# Patient Record
Sex: Male | Born: 1960
Health system: Southern US, Community
[De-identification: ages and names within clinical notes are randomized; demographics above are authoritative.]

## PROBLEM LIST (undated history)

## (undated) DIAGNOSIS — I10 Essential (primary) hypertension: Secondary | ICD-10-CM

## (undated) DIAGNOSIS — E785 Hyperlipidemia, unspecified: Secondary | ICD-10-CM

## (undated) DIAGNOSIS — I2510 Atherosclerotic heart disease of native coronary artery without angina pectoris: Secondary | ICD-10-CM

## (undated) HISTORY — PX: CARDIAC CATHETERIZATION: SHX172

## (undated) HISTORY — PX: TONSILLECTOMY: SUR1361

---

## 1998-06-27 ENCOUNTER — Encounter: Admission: RE | Admit: 1998-06-27 | Discharge: 1998-06-27 | Payer: Self-pay | Admitting: *Deleted

## 2004-08-28 ENCOUNTER — Ambulatory Visit: Payer: Self-pay | Admitting: Family Medicine

## 2009-10-23 ENCOUNTER — Ambulatory Visit: Payer: Self-pay | Admitting: Otolaryngology

## 2009-12-16 ENCOUNTER — Ambulatory Visit: Payer: Self-pay | Admitting: Otolaryngology

## 2009-12-22 ENCOUNTER — Ambulatory Visit: Payer: Self-pay | Admitting: Otolaryngology

## 2016-02-28 ENCOUNTER — Emergency Department: Payer: 59

## 2016-02-28 ENCOUNTER — Encounter: Payer: Self-pay | Admitting: *Deleted

## 2016-02-28 ENCOUNTER — Emergency Department
Admission: EM | Admit: 2016-02-28 | Discharge: 2016-02-28 | Disposition: A | Discharge status: Home / Self Care | Payer: 59 | Attending: Emergency Medicine | Admitting: Emergency Medicine

## 2016-02-28 DIAGNOSIS — Y9389 Activity, other specified: Secondary | ICD-10-CM | POA: Insufficient documentation

## 2016-02-28 DIAGNOSIS — Y92410 Unspecified street and highway as the place of occurrence of the external cause: Secondary | ICD-10-CM | POA: Diagnosis not present

## 2016-02-28 DIAGNOSIS — F17210 Nicotine dependence, cigarettes, uncomplicated: Secondary | ICD-10-CM | POA: Diagnosis not present

## 2016-02-28 DIAGNOSIS — S80212A Abrasion, left knee, initial encounter: Secondary | ICD-10-CM | POA: Insufficient documentation

## 2016-02-28 DIAGNOSIS — S8992XA Unspecified injury of left lower leg, initial encounter: Secondary | ICD-10-CM | POA: Diagnosis present

## 2016-02-28 DIAGNOSIS — Y999 Unspecified external cause status: Secondary | ICD-10-CM | POA: Insufficient documentation

## 2016-02-28 DIAGNOSIS — M25511 Pain in right shoulder: Secondary | ICD-10-CM | POA: Insufficient documentation

## 2016-02-28 MED ORDER — TRAMADOL HCL 50 MG PO TABS: ORAL_TABLET | ORAL | Status: DC

## 2016-02-28 MED ORDER — TETANUS-DIPHTH-ACELL PERTUSSIS 5-2.5-18.5 LF-MCG/0.5 IM SUSP
0.5000 mL | Freq: Once | INTRAMUSCULAR | Status: AC
  Administered 2016-02-28: 0.5 mL via INTRAMUSCULAR
  Filled 2016-02-28: qty 0.5
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
## 2016-02-28 NOTE — ED Notes (Signed)
Discussed discharge instructions, prescriptions, and follow-up care with patient. No questions or concerns at this time. Pt stable at discharge.  
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
## 2016-02-28 NOTE — Discharge Instructions (Signed)
Follow-up with [facility] if any continued problems. Ice to shoulder as needed for comfort. Take naproxen as needed for inflammation. Tramadol as needed for severe pain.

## 2016-03-04 ENCOUNTER — Ambulatory Visit
Admission: EM | Admit: 2016-03-04 | Discharge: 2016-03-04 | Disposition: A | Discharge status: Home / Self Care | Payer: 59 | Attending: Family Medicine | Admitting: Family Medicine

## 2016-03-04 ENCOUNTER — Encounter: Payer: Self-pay | Admitting: *Deleted

## 2016-03-04 DIAGNOSIS — S40011A Contusion of right shoulder, initial encounter: Secondary | ICD-10-CM

## 2016-03-04 DIAGNOSIS — S161XXA Strain of muscle, fascia and tendon at neck level, initial encounter: Secondary | ICD-10-CM

## 2016-03-04 MED ORDER — NAPROXEN 500 MG PO TABS: 500.0000 mg | ORAL_TABLET | Freq: Two times a day (BID) | ORAL | Status: DC

## 2016-03-04 MED ORDER — TIZANIDINE HCL 4 MG PO TABS: 4.0000 mg | ORAL_TABLET | Freq: Four times a day (QID) | ORAL | Status: DC | PRN
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
## 2016-03-04 NOTE — Discharge Instructions (Signed)
Contusion °A contusion is a deep bruise. Contusions are the result of a blunt injury to tissues and muscle fibers under the skin. The injury causes bleeding under the skin. The skin overlying the contusion may turn blue, purple, or yellow. Minor injuries will give you a painless contusion, but more severe contusions may stay painful and swollen for a few weeks.  °CAUSES  °This condition is usually caused by a blow, trauma, or direct force to an area of the body. °SYMPTOMS  °Symptoms of this condition include: °· Swelling of the injured area. °· Pain and tenderness in the injured area. °· Discoloration. The area may have redness and then turn blue, purple, or yellow. °DIAGNOSIS  °This condition is diagnosed based on a physical exam and medical history. An X-ray, CT scan, or MRI may be needed to determine if there are any associated injuries, such as broken bones (fractures). °TREATMENT  °Specific treatment for this condition depends on what area of the body was injured. In general, the best treatment for a contusion is resting, icing, applying pressure to (compression), and elevating the injured area. This is often called the RICE strategy. Over-the-counter anti-inflammatory medicines may also be recommended for pain control.  °HOME CARE INSTRUCTIONS  °· Rest the injured area. °· If directed, apply ice to the injured area: °· Put ice in a plastic bag. °· Place a towel between your skin and the bag. °· Leave the ice on for 20 minutes, 2-3 times per day. °· If directed, apply light compression to the injured area using an elastic bandage. Make sure the bandage is not wrapped too tightly. Remove and reapply the bandage as directed by your health care provider. °· If possible, raise (elevate) the injured area above the level of your heart while you are sitting or lying down. °· Take over-the-counter and prescription medicines only as told by your health care provider. °SEEK MEDICAL CARE IF: °· Your symptoms do not  improve after several days of treatment. °· Your symptoms get worse. °· You have difficulty moving the injured area. °SEEK IMMEDIATE MEDICAL CARE IF:  °· You have severe pain. °· You have numbness in a hand or foot. °· Your hand or foot turns pale or cold. °  °This information is not intended to replace advice given to you by your health care provider. Make sure you discuss any questions you have with your health care provider. °  °Document Released: 06/09/2005 Document Revised: 05/21/2015 Document Reviewed: 01/15/2015 °Elsevier Interactive Patient Education ©2016 [employer] °Cervical Sprain °A cervical sprain is an injury in the neck in which the strong, fibrous tissues (ligaments) that connect your neck bones stretch or tear. Cervical sprains can range from mild to severe. Severe cervical sprains can cause the neck vertebrae to be unstable. This can lead to damage of the spinal cord and can result in serious nervous system problems. The amount of time it takes for a cervical sprain to get better depends on the cause and extent of the injury. Most cervical sprains heal in 1 to 3 weeks. °CAUSES  °Severe cervical sprains may be caused by:  °· Contact sport injuries (such as from football, rugby, wrestling, hockey, auto racing, gymnastics, diving, martial arts, or boxing).   °· Motor vehicle collisions.   °· Whiplash injuries. This is an injury from a sudden forward and backward whipping movement of the head and neck.  °· Falls.   °Mild cervical sprains may be caused by:  °· Being in an awkward position, such as while   cradling a telephone between your ear and shoulder.   °· Sitting in a chair that does not offer proper support.   °· Working at a poorly designed computer station.   °· Looking up or down for long periods of time.   °SYMPTOMS  °· Pain, soreness, stiffness, or a burning sensation in the front, back, or sides of the neck. This discomfort may develop immediately after the injury or slowly, 24 hours or  more after the injury.   °· Pain or tenderness directly in the middle of the back of the neck.   °· Shoulder or upper back pain.   °· Limited ability to move the neck.   °· Headache.   °· Dizziness.   °· Weakness, numbness, or tingling in the hands or arms.   °· Muscle spasms.   °· Difficulty swallowing or chewing.   °· Tenderness and swelling of the neck.   °DIAGNOSIS  °Most of the time your health care provider can diagnose a cervical sprain by taking your history and doing a physical exam. Your health care provider will ask about previous neck injuries and any known neck problems, such as arthritis in the neck. X-rays may be taken to find out if there are any other problems, such as with the bones of the neck. Other tests, such as a CT scan or MRI, may also be needed.  °TREATMENT  °Treatment depends on the severity of the cervical sprain. Mild sprains can be treated with rest, keeping the neck in place (immobilization), and pain medicines. Severe cervical sprains are immediately immobilized. Further treatment is done to help with pain, muscle spasms, and other symptoms and may include: °· Medicines, such as pain relievers, numbing medicines, or muscle relaxants.   °· Physical therapy. This may involve stretching exercises, strengthening exercises, and posture training. Exercises and improved posture can help stabilize the neck, strengthen muscles, and help stop symptoms from returning.   °HOME CARE INSTRUCTIONS  °· Put ice on the injured area.   °¨ Put ice in a plastic bag.   °¨ Place a towel between your skin and the bag.   °¨ Leave the ice on for 15-20 minutes, 3-4 times a day.   °· If your injury was severe, you may have been given a cervical collar to wear. A cervical collar is a two-piece collar designed to keep your neck from moving while it heals. °¨ Do not remove the collar unless instructed by your health care provider. °¨ If you have long hair, keep it outside of the collar. °¨ Ask your health care  provider before making any adjustments to your collar. Minor adjustments may be required over time to improve comfort and reduce pressure on your chin or on the back of your head. °¨ If you are allowed to remove the collar for cleaning or bathing, follow your health care provider's instructions on how to do so safely. °¨ Keep your collar clean by wiping it with mild soap and water and drying it completely. If the collar you have been given includes removable pads, remove them every 1-2 days and hand wash them with soap and water. Allow them to air dry. They should be completely dry before you wear them in the collar. °¨ If you are allowed to remove the collar for cleaning and bathing, wash and dry the skin of your neck. Check your skin for irritation or sores. If you see any, tell your health care provider. °¨ Do not drive while wearing the collar.   °· Only take over-the-counter or prescription medicines for pain, discomfort, or fever as directed by your health care   provider.   °· Keep all follow-up appointments as directed by your health care provider.   °· Keep all physical therapy appointments as directed by your health care provider.   °· Make any needed adjustments to your workstation to promote good posture.   °· Avoid positions and activities that make your symptoms worse.   °· Warm up and stretch before being active to help prevent problems.   °SEEK MEDICAL CARE IF:  °· Your pain is not controlled with medicine.   °· You are unable to decrease your pain medicine over time as planned.   °· Your activity level is not improving as expected.   °SEEK IMMEDIATE MEDICAL CARE IF:  °· You develop any bleeding. °· You develop stomach upset. °· You have signs of an allergic reaction to your medicine.   °· Your symptoms get worse.   °· You develop new, unexplained symptoms.   °· You have numbness, tingling, weakness, or paralysis in any part of your body.   °MAKE SURE YOU:  °· Understand these instructions. °· Will  watch your condition. °· Will get help right away if you are not doing well or get worse. °  °This information is not intended to replace advice given to you by your health care provider. Make sure you discuss any questions you have with your health care provider. °  °Document Released: 06/27/2007 Document Revised: 09/04/2013 Document Reviewed: 03/07/2013 °Elsevier Interactive Patient Education ©2016 [employer] Inc. ° °

## 2016-11-14 ENCOUNTER — Inpatient Hospital Stay
Admission: EM | Admit: 2016-11-14 | Discharge: 2016-11-16 | DRG: 247 | Disposition: A | Discharge status: Home / Self Care | Payer: 59 | Attending: Internal Medicine | Admitting: Internal Medicine

## 2016-11-14 ENCOUNTER — Emergency Department: Payer: 59

## 2016-11-14 ENCOUNTER — Encounter: Payer: Self-pay | Admitting: Emergency Medicine

## 2016-11-14 ENCOUNTER — Ambulatory Visit (INDEPENDENT_AMBULATORY_CARE_PROVIDER_SITE_OTHER)
Admission: EM | Admit: 2016-11-14 | Discharge: 2016-11-14 | Disposition: A | Discharge status: Other Acute Inpatient Hospital | Payer: 59 | Source: Home / Self Care | Attending: Family Medicine | Admitting: Family Medicine

## 2016-11-14 DIAGNOSIS — Z809 Family history of malignant neoplasm, unspecified: Secondary | ICD-10-CM | POA: Diagnosis not present

## 2016-11-14 DIAGNOSIS — R7989 Other specified abnormal findings of blood chemistry: Secondary | ICD-10-CM

## 2016-11-14 DIAGNOSIS — R072 Precordial pain: Secondary | ICD-10-CM

## 2016-11-14 DIAGNOSIS — I10 Essential (primary) hypertension: Secondary | ICD-10-CM | POA: Diagnosis present

## 2016-11-14 DIAGNOSIS — Z885 Allergy status to narcotic agent status: Secondary | ICD-10-CM

## 2016-11-14 DIAGNOSIS — R079 Chest pain, unspecified: Secondary | ICD-10-CM

## 2016-11-14 DIAGNOSIS — R778 Other specified abnormalities of plasma proteins: Secondary | ICD-10-CM

## 2016-11-14 DIAGNOSIS — E785 Hyperlipidemia, unspecified: Secondary | ICD-10-CM | POA: Diagnosis present

## 2016-11-14 DIAGNOSIS — I214 Non-ST elevation (NSTEMI) myocardial infarction: Secondary | ICD-10-CM

## 2016-11-14 DIAGNOSIS — I25110 Atherosclerotic heart disease of native coronary artery with unstable angina pectoris: Secondary | ICD-10-CM | POA: Diagnosis present

## 2016-11-14 DIAGNOSIS — Z8249 Family history of ischemic heart disease and other diseases of the circulatory system: Secondary | ICD-10-CM | POA: Diagnosis not present

## 2016-11-14 DIAGNOSIS — F17210 Nicotine dependence, cigarettes, uncomplicated: Secondary | ICD-10-CM | POA: Diagnosis present

## 2016-11-14 DIAGNOSIS — R0602 Shortness of breath: Secondary | ICD-10-CM | POA: Diagnosis present

## 2016-11-14 DIAGNOSIS — I219 Acute myocardial infarction, unspecified: Secondary | ICD-10-CM

## 2016-11-14 HISTORY — DX: Hyperlipidemia, unspecified: E78.5

## 2016-11-14 HISTORY — DX: Essential (primary) hypertension: I10

## 2016-11-14 HISTORY — DX: Acute myocardial infarction, unspecified: I21.9

## 2016-11-14 LAB — CBC WITH DIFFERENTIAL/PLATELET
Basophils Absolute: 0.1 10*3/uL (ref 0–0.1)
Basophils Relative: 1 %
EOS ABS: 0.1 10*3/uL (ref 0–0.7)
Eosinophils Relative: 1 %
HEMATOCRIT: 42.8 % (ref 40.0–52.0)
HEMOGLOBIN: 14.6 g/dL (ref 13.0–18.0)
LYMPHS ABS: 2.9 10*3/uL (ref 1.0–3.6)
Lymphocytes Relative: 30 %
MCH: 29.9 pg (ref 26.0–34.0)
MCHC: 34.1 g/dL (ref 32.0–36.0)
MCV: 87.7 fL (ref 80.0–100.0)
Monocytes Absolute: 0.6 10*3/uL (ref 0.2–1.0)
Monocytes Relative: 7 %
NEUTROS ABS: 5.8 10*3/uL (ref 1.4–6.5)
NEUTROS PCT: 61 %
Platelets: 229 10*3/uL (ref 150–440)
RBC: 4.88 MIL/uL (ref 4.40–5.90)
RDW: 13.5 % (ref 11.5–14.5)
WBC: 9.4 10*3/uL (ref 3.8–10.6)

## 2016-11-14 LAB — BASIC METABOLIC PANEL
ANION GAP: 5 (ref 5–15)
BUN: 16 mg/dL (ref 6–20)
CALCIUM: 9.6 mg/dL (ref 8.9–10.3)
CO2: 25 mmol/L (ref 22–32)
Chloride: 105 mmol/L (ref 101–111)
Creatinine, Ser: 1.15 mg/dL (ref 0.61–1.24)
GFR calc non Af Amer: >60 mL/min (ref >60)
Glucose, Bld: 99 mg/dL (ref 65–99)
Potassium: 4 mmol/L (ref 3.5–5.1)
SODIUM: 135 mmol/L (ref 135–145)

## 2016-11-14 LAB — LIPID PANEL
CHOL/HDL RATIO: 3.5 ratio
Cholesterol: 180 mg/dL (ref 0–200)
HDL: 52 mg/dL (ref >40)
LDL Cholesterol: 105 mg/dL — ABNORMAL HIGH (ref 0–99)
TRIGLYCERIDES: 117 mg/dL (ref <150)
VLDL: 23 mg/dL (ref 0–40)

## 2016-11-14 LAB — TROPONIN I
TROPONIN I: 5.14 ng/mL — AB (ref <0.03)
Troponin I: 5.79 ng/mL (ref <0.03)
Troponin I: 5.24 ng/mL (ref <0.03)

## 2016-11-14 LAB — CBC
HEMATOCRIT: 40.8 % (ref 40.0–52.0)
Hemoglobin: 14 g/dL (ref 13.0–18.0)
MCH: 30.4 pg (ref 26.0–34.0)
MCHC: 34.3 g/dL (ref 32.0–36.0)
MCV: 88.7 fL (ref 80.0–100.0)
PLATELETS: 220 10*3/uL (ref 150–440)
RBC: 4.6 MIL/uL (ref 4.40–5.90)
RDW: 13.4 % (ref 11.5–14.5)
WBC: 9.6 10*3/uL (ref 3.8–10.6)

## 2016-11-14 LAB — COMPREHENSIVE METABOLIC PANEL
ALBUMIN: 4.1 g/dL (ref 3.5–5.0)
ALK PHOS: 68 U/L (ref 38–126)
ALT: 15 U/L — AB (ref 17–63)
AST: 43 U/L — ABNORMAL HIGH (ref 15–41)
Anion gap: 5 (ref 5–15)
BILIRUBIN TOTAL: 0.6 mg/dL (ref 0.3–1.2)
BUN: 17 mg/dL (ref 6–20)
CALCIUM: 10.1 mg/dL (ref 8.9–10.3)
CO2: 28 mmol/L (ref 22–32)
CREATININE: 1.18 mg/dL (ref 0.61–1.24)
Chloride: 104 mmol/L (ref 101–111)
GFR calc Af Amer: >60 mL/min (ref >60)
GFR calc non Af Amer: >60 mL/min (ref >60)
GLUCOSE: 114 mg/dL — AB (ref 65–99)
Potassium: 4.9 mmol/L (ref 3.5–5.1)
Sodium: 137 mmol/L (ref 135–145)
Total Protein: 7.5 g/dL (ref 6.5–8.1)

## 2016-11-14 LAB — PROTIME-INR
INR: 0.89
INR: 0.9
PROTHROMBIN TIME: 12.1 s (ref 11.4–15.2)
Prothrombin Time: 12 seconds (ref 11.4–15.2)

## 2016-11-14 LAB — APTT: aPTT: 27 seconds (ref 24–36)

## 2016-11-14 MED ORDER — DIPHENHYDRAMINE HCL 25 MG PO CAPS
25.0000 mg | ORAL_CAPSULE | Freq: Every day | ORAL | Status: DC
  Administered 2016-11-14 – 2016-11-15 (×2): 25 mg via ORAL
  Filled 2016-11-14 (×2): qty 1

## 2016-11-14 MED ORDER — SODIUM CHLORIDE 0.9% FLUSH
3.0000 mL | Freq: Two times a day (BID) | INTRAVENOUS | Status: DC
  Administered 2016-11-15 – 2016-11-16 (×3): 3 mL via INTRAVENOUS

## 2016-11-14 MED ORDER — AMLODIPINE BESYLATE 5 MG PO TABS
5.0000 mg | ORAL_TABLET | Freq: Every day | ORAL | Status: DC
  Administered 2016-11-14 – 2016-11-15 (×2): 5 mg via ORAL
  Filled 2016-11-14 (×2): qty 1

## 2016-11-14 MED ORDER — DIPHENHYDRAMINE-APAP (SLEEP) 25-500 MG PO TABS: 1.0000 | ORAL_TABLET | Freq: Every day | ORAL | Status: DC

## 2016-11-14 MED ORDER — TIROFIBAN HCL IV 12.5 MG/250 ML
0.1500 ug/kg/min | INTRAVENOUS | Status: AC
  Administered 2016-11-14 (×2): 0.15 ug/kg/min via INTRAVENOUS
  Filled 2016-11-14 (×3): qty 100
  Filled 2016-11-14: qty 250

## 2016-11-14 MED ORDER — DOCUSATE SODIUM 100 MG PO CAPS: 100.0000 mg | ORAL_CAPSULE | Freq: Two times a day (BID) | ORAL | Status: DC | PRN

## 2016-11-14 MED ORDER — SODIUM CHLORIDE 0.9% FLUSH: 3.0000 mL | INTRAVENOUS | Status: DC | PRN

## 2016-11-14 MED ORDER — SODIUM CHLORIDE 0.9 % WEIGHT BASED INFUSION
1.0000 mL/kg/h | INTRAVENOUS | Status: DC
Start: 2016-11-15 — End: 2016-11-15
  Administered 2016-11-15: 1 mL/kg/h via INTRAVENOUS

## 2016-11-14 MED ORDER — SODIUM CHLORIDE 0.9 % IV SOLN: 250.0000 mL | INTRAVENOUS | Status: DC | PRN

## 2016-11-14 MED ORDER — LORATADINE 10 MG PO TABS
10.0000 mg | ORAL_TABLET | Freq: Every evening | ORAL | Status: DC
  Administered 2016-11-14 – 2016-11-15 (×2): 10 mg via ORAL
  Filled 2016-11-14 (×2): qty 1

## 2016-11-14 MED ORDER — NICOTINE 21 MG/24HR TD PT24
21.0000 mg | MEDICATED_PATCH | Freq: Every day | TRANSDERMAL | Status: DC
  Administered 2016-11-14 – 2016-11-15 (×2): 21 mg via TRANSDERMAL
  Filled 2016-11-14 (×3): qty 1

## 2016-11-14 MED ORDER — ASPIRIN 81 MG PO CHEW
324.0000 mg | CHEWABLE_TABLET | Freq: Once | ORAL | Status: AC
  Administered 2016-11-14: 324 mg via ORAL

## 2016-11-14 MED ORDER — TIROFIBAN (AGGRASTAT) BOLUS VIA INFUSION
25.0000 ug/kg | Freq: Once | INTRAVENOUS | Status: AC
  Administered 2016-11-14: 1927.5 ug via INTRAVENOUS
  Filled 2016-11-14 (×2): qty 39

## 2016-11-14 MED ORDER — NAPROXEN SODIUM 275 MG PO TABS
440.0000 mg | ORAL_TABLET | Freq: Every day | ORAL | Status: DC
  Filled 2016-11-14: qty 2

## 2016-11-14 MED ORDER — SODIUM CHLORIDE 0.9 % WEIGHT BASED INFUSION: 3.0000 mL/kg/h | INTRAVENOUS | Status: AC

## 2016-11-14 MED ORDER — HEPARIN (PORCINE) IN NACL 100-0.45 UNIT/ML-% IJ SOLN
12.0000 [IU]/kg/h | INTRAMUSCULAR | Status: DC
  Filled 2016-11-14: qty 250

## 2016-11-14 MED ORDER — FENTANYL CITRATE (PF) 100 MCG/2ML IJ SOLN: 100.0000 ug | INTRAMUSCULAR | Status: DC | PRN

## 2016-11-14 MED ORDER — NITROGLYCERIN 0.4 MG SL SUBL: 0.4000 mg | SUBLINGUAL_TABLET | SUBLINGUAL | Status: DC | PRN

## 2016-11-14 MED ORDER — HEPARIN BOLUS VIA INFUSION
4000.0000 [IU] | Freq: Once | INTRAVENOUS | Status: DC
Start: 2016-11-14 — End: 2016-11-14
  Filled 2016-11-14: qty 4000

## 2016-11-14 MED ORDER — NITROGLYCERIN 2 % TD OINT
0.5000 [in_us] | TOPICAL_OINTMENT | Freq: Once | TRANSDERMAL | Status: AC
  Administered 2016-11-14: 0.5 [in_us] via TOPICAL
  Filled 2016-11-14: qty 1

## 2016-11-14 MED ORDER — ASPIRIN 81 MG PO CHEW
81.0000 mg | CHEWABLE_TABLET | ORAL | Status: AC
  Administered 2016-11-15: 81 mg via ORAL
  Filled 2016-11-14: qty 1

## 2016-11-14 MED ORDER — ASPIRIN EC 325 MG PO TBEC: 325.0000 mg | DELAYED_RELEASE_TABLET | Freq: Every day | ORAL | Status: DC

## 2016-11-14 MED ORDER — NITROGLYCERIN 2 % TD OINT
0.5000 [in_us] | TOPICAL_OINTMENT | Freq: Four times a day (QID) | TRANSDERMAL | Status: DC
  Administered 2016-11-14 – 2016-11-15 (×3): 0.5 [in_us] via TOPICAL
  Filled 2016-11-14 (×3): qty 1

## 2016-11-14 MED ORDER — ACETAMINOPHEN 500 MG PO TABS
500.0000 mg | ORAL_TABLET | Freq: Every day | ORAL | Status: DC
  Administered 2016-11-14 – 2016-11-15 (×2): 500 mg via ORAL
  Filled 2016-11-14 (×2): qty 1

## 2016-11-14 MED ORDER — SODIUM CHLORIDE 0.9% FLUSH
3.0000 mL | Freq: Two times a day (BID) | INTRAVENOUS | Status: DC
  Administered 2016-11-14 – 2016-11-15 (×2): 3 mL via INTRAVENOUS

## 2016-11-14 NOTE — Discharge Instructions (Signed)
Going directly to ER.

## 2016-11-14 NOTE — ED Triage Notes (Signed)
Pt had episodes yesterday were he felt like someone was sitting on his chest, broke out in cold sweat and very nausea. He feels weak and when he walks he gets short of breath.

## 2016-11-14 NOTE — ED Provider Notes (Signed)
[facility] Emergency Department Provider Note    First MD Initiated Contact with Patient 11/14/16 [phone]     (approximate)  I have reviewed the triage vital signs and the nursing notes.   HISTORY  Chief Complaint Chest Pain    HPI Gabriel Grant is a 56 y.o. male with 2 days of progressively worsening exertional chest pain. Patient with a long history of tobacco abuse, hypertension as well as hyperlipidemia. States that yesterday he was working in the yard developed a mid sternal chest pain and pressure that he states felt like a "bruise "associated with shortness of breath and diaphoresis. This has been lasted roughly 30-45 minutes and improved with rest. Still had a dull aching sensation in his chest over the night. Woke up this morning and went to walk his dog and developed recurrence of the same chest pain. This time there is no diaphoresis but did have shortness of breath. Denies any cough. No orthopnea. No previous known history of CAD. Patient would urgent care where EKG was taken that showed evidence of nonspecific T-wave inversions in 3 and aVF. He was given nitroglycerin with improvement in his chest pain. He arrives to the ER with chest pain rated 2 out of 10 in severity. States he feels much better.   History reviewed. No pertinent past medical history. Family History  Problem Relation Age of Onset  . Cancer Mother   . Hypertension Father   . CAD Father    Past Surgical History:  Procedure Laterality Date  . TONSILLECTOMY     There are no active problems to display for this patient.     Prior to Admission medications   Medication Sig Start Date End Date Taking? Authorizing Provider  diphenhydramine-acetaminophen (TYLENOL PM) 25-500 MG TABS tablet Take 1 tablet by mouth at bedtime.   Yes Historical Provider, MD  loratadine (CLARITIN) 10 MG tablet Take 10 mg by mouth daily.   Yes Historical Provider, MD  naproxen sodium (ANAPROX) 220 MG  tablet Take 440 mg by mouth at bedtime.   Yes Historical Provider, MD    Allergies Codeine    Social History Social History  Substance Use Topics  . Smoking status: Current Every Day Smoker    Packs/day: 1.00    Types: Cigarettes  . Smokeless tobacco: Never Used  . Alcohol use No    Review of Systems Patient denies headaches, rhinorrhea, blurry vision, numbness, shortness of breath, chest pain, edema, cough, abdominal pain, nausea, vomiting, diarrhea, dysuria, fevers, rashes or hallucinations unless otherwise stated above in HPI. ____________________________________________   PHYSICAL EXAM:  VITAL SIGNS: Vitals:   11/14/16 1030 11/14/16 1100  BP: (!) 148/91 (!) 168/94  Pulse: (!) 55 (!) 57  Resp: 17 14  Temp:      Constitutional: Alert and oriented. Well appearing and in no acute distress. Eyes: Conjunctivae are normal. PERRL. EOMI. Head: Atraumatic. Nose: No congestion/rhinnorhea. Mouth/Throat: Mucous membranes are moist.  Oropharynx non-erythematous. Neck: No stridor. Painless ROM. No cervical spine tenderness to palpation Hematological/Lymphatic/Immunilogical: No cervical lymphadenopathy. Cardiovascular: Normal rate, regular rhythm. Grossly normal heart sounds.  Good peripheral circulation. Respiratory: Normal respiratory effort.  No retractions. Lungs CTAB. Gastrointestinal: Soft and nontender. No distention. No abdominal bruits. No CVA tenderness. Genitourinary:  Musculoskeletal: No lower extremity tenderness nor edema.  No joint effusions. Neurologic:  Normal speech and language. No gross focal neurologic deficits are appreciated. No gait instability. Skin:  Skin is warm, dry and intact. No rash noted. Psychiatric: Mood  and affect are normal. Speech and behavior are normal.  ____________________________________________   LABS (all labs ordered are listed, but only abnormal results are displayed)  Results for orders placed or performed during the hospital  encounter of 11/14/16 (from the past 24 hour(s))  CBC with Differential/Platelet     Status: None   Collection Time: 11/14/16  9:49 AM  Result Value Ref Range   WBC 9.4 3.8 - 10.6 K/uL   RBC 4.88 4.40 - 5.90 MIL/uL   Hemoglobin 14.6 13.0 - 18.0 g/dL   HCT 42.8 40.0 - 52.0 %   MCV 87.7 80.0 - 100.0 fL   MCH 29.9 26.0 - 34.0 pg   MCHC 34.1 32.0 - 36.0 g/dL   RDW 13.5 11.5 - 14.5 %   Platelets 229 150 - 440 K/uL   Neutrophils Relative % 61 %   Neutro Abs 5.8 1.4 - 6.5 K/uL   Lymphocytes Relative 30 %   Lymphs Abs 2.9 1.0 - 3.6 K/uL   Monocytes Relative 7 %   Monocytes Absolute 0.6 0.2 - 1.0 K/uL   Eosinophils Relative 1 %   Eosinophils Absolute 0.1 0 - 0.7 K/uL   Basophils Relative 1 %   Basophils Absolute 0.1 0 - 0.1 K/uL  Comprehensive metabolic panel     Status: Abnormal   Collection Time: 11/14/16  9:49 AM  Result Value Ref Range   Sodium 137 135 - 145 mmol/L   Potassium 4.9 3.5 - 5.1 mmol/L   Chloride 104 101 - 111 mmol/L   CO2 28 22 - 32 mmol/L   Glucose, Bld 114 (H) 65 - 99 mg/dL   BUN 17 6 - 20 mg/dL   Creatinine, Ser 1.18 0.61 - 1.24 mg/dL   Calcium 10.1 8.9 - 10.3 mg/dL   Total Protein 7.5 6.5 - 8.1 g/dL   Albumin 4.1 3.5 - 5.0 g/dL   AST 43 (H) 15 - 41 U/L   ALT 15 (L) 17 - 63 U/L   Alkaline Phosphatase 68 38 - 126 U/L   Total Bilirubin 0.6 0.3 - 1.2 mg/dL   GFR calc non Af Amer >60 >60 mL/min   GFR calc Af Amer >60 >60 mL/min   Anion gap 5 5 - 15  Troponin I     Status: Abnormal   Collection Time: 11/14/16  9:49 AM  Result Value Ref Range   Troponin I 5.79 (HH) <0.03 ng/mL  APTT     Status: None   Collection Time: 11/14/16 11:20 AM  Result Value Ref Range   aPTT 27 24 - 36 seconds  Protime-INR     Status: None   Collection Time: 11/14/16 11:20 AM  Result Value Ref Range   Prothrombin Time 12.0 11.4 - 15.2 seconds   INR 0.89    ____________________________________________  EKG My review and personal interpretation at Time: 9:34   Indication: chest  pain  Rate: 50  Rhythm: sinus Axis: normal Other: non specific st changes,no STEMI ____________________________________________  RADIOLOGY  I personally reviewed all radiographic images ordered to evaluate for the above acute complaints and reviewed radiology reports and findings.  These findings were personally discussed with the patient.  Please see medical record for radiology report. ____________________________________________   PROCEDURES  Procedure(s) performed:  Procedures    Critical Care performed: yes CRITICAL CARE Performed by: Merlyn Lot   Total critical care time: 30 minutes  Critical care time was exclusive of separately billable procedures and treating other patients.  Critical care was necessary  to treat or prevent imminent or life-threatening deterioration.  Critical care was time spent personally by me on the following activities: development of treatment plan with patient and/or surrogate as well as nursing, discussions with consultants, evaluation of patient's response to treatment, examination of patient, obtaining history from patient or surrogate, ordering and performing treatments and interventions, ordering and review of laboratory studies, ordering and review of radiographic studies, pulse oximetry and re-evaluation of patient's condition.  ____________________________________________   INITIAL IMPRESSION / ASSESSMENT AND PLAN / ED COURSE  Pertinent labs & imaging results that were available during my care of the patient were reviewed by me and considered in my medical decision making (see chart for details).  DDX: ACS, pericarditis, esophagitis, boerhaaves,  dissection, pna, bronchitis, costochondritis   TAHIR DIAMICO is a 56 y.o. who presents to the ED with chest pain as described above with EKG changes from urgent care to the ER here. No evidence of ST elevation MI. T-wave now upright in aVF and less pronounced in lead III.  chest x-ray  with a crisp aortic knob. Do not feel this is clinically consistent with dissection. Possible underlying component of bronchitis or COPD however given the EKG changes and his high risk to fill unstable angina is a concern.  The patient will be placed on continuous pulse oximetry and telemetry for monitoring.  Laboratory evaluation will be sent to evaluate for the above complaints.     Clinical Course as of Nov 15 1219  Nancy Fetter Nov 14, 2016  1030 Troponin elevated to 5.7. Patient currently chest pain-free. Based on his history and my concern for unstable angina will order heparin drip. Patient is artery received aspirin. We'll contact cardiology for further recommendations. Patient will require admission for further evaluation and management.  [PR]  1042 Spoke with Dr. Carren Rang who has recommended Aggrastat. He is currently at bedside evaluating patient. On reexamination the patient has no chest pain at this time. Do not feel his clinical consistent with a STEMI.  Have discussed with the patient and available family all diagnostics and treatments performed thus far and all questions were answered to the best of my ability. The patient demonstrates understanding and agreement with plan.   [PR]    Clinical Course User Index [PR] Merlyn Lot, MD     ____________________________________________   FINAL CLINICAL IMPRESSION(S) / ED DIAGNOSES  Final diagnoses:  Chest pain, unspecified type  Shortness of breath  Elevated troponin I level      NEW MEDICATIONS STARTED DURING THIS VISIT:  New Prescriptions   No medications on file     Note:  This document was prepared using Dragon voice recognition software and may include unintentional dictation errors.    Merlyn Lot, MD 11/14/16 [phone]

## 2016-11-14 NOTE — ED Triage Notes (Signed)
Pt ems from urgent care for chest pain that started yesterday. Pt describes pain as pressure, felt nauseated and sweaty. Pt given 324 aspirin and 1 nitro spray. Pt without pain now.

## 2016-11-14 NOTE — Progress Notes (Signed)
Gabriel Grant is a 56 y.o. male  [id number]  Primary Cardiologist: Neoma Laming Reason for Consultation: Chest pain with elevated troponin/non-STEMI  HPI: This is a 56 year old white male with a past medical history of hypertension hyperlipidemia and 1 pack per day smoking and strong family history of coronary artery disease presented with chest pains which started 4 PM yesterday. Patient states chest pain yesterday occurred like a dull to a pressure type lasting 15-20 minutes. Chest pain continued intermittently and this morning had recurrent 10-15 minute chest pain and presented to the emergency room where he was given nitroglycerin and currently is chest pain-free.   Review of Systems: No orthopnea PND or leg swelling   History reviewed. No pertinent past medical history.   (Not in a hospital admission)   . tirofiban  25 mcg/kg Intravenous Once    Infusions: . tirofiban      Allergies  Allergen Reactions  . Codeine Other (See Comments)    Severe headache.    Social History   Social History  . Marital status: Married    Spouse name: N/A  . Number of children: N/A  . Years of education: N/A   Occupational History  . Not on file.   Social History Main Topics  . Smoking status: Current Every Day Smoker    Packs/day: 1.00    Types: Cigarettes  . Smokeless tobacco: Never Used  . Alcohol use No  . Drug use: No  . Sexual activity: Not on file   Other Topics Concern  . Not on file   Social History Narrative  . No narrative on file    Family History  Problem Relation Age of Onset  . Cancer Mother   . Hypertension Father   . CAD Father     PHYSICAL EXAM: Vitals:   11/14/16 0939 11/14/16 1000  BP: (!) 153/95 (!) 164/98  Pulse: 63 (!) 53  Resp:  15  Temp: 97.7 F (36.5 C)     No intake or output data in the 24 hours ending 11/14/16 1114  General:  Well appearing. No respiratory difficulty HEENT: normal Neck: supple. no JVD. Carotids 2+ bilat;  no bruits. No lymphadenopathy or thryomegaly appreciated. Cor: PMI nondisplaced. Regular rate & rhythm. No rubs, gallops or murmurs. Lungs: clear Abdomen: soft, nontender, nondistended. No hepatosplenomegaly. No bruits or masses. Good bowel sounds. Extremities: no cyanosis, clubbing, rash, edema Neuro: alert & oriented x 3, cranial nerves grossly intact. moves all 4 extremities w/o difficulty. Affect pleasant.  [id number] sinus rhythm with LVH and nonspecific ST-T changes  Results for orders placed or performed during the hospital encounter of 11/14/16 (from the past 24 hour(s))  CBC with Differential/Platelet     Status: None   Collection Time: 11/14/16  9:49 AM  Result Value Ref Range   WBC 9.4 3.8 - 10.6 K/uL   RBC 4.88 4.40 - 5.90 MIL/uL   Hemoglobin 14.6 13.0 - 18.0 g/dL   HCT 42.8 40.0 - 52.0 %   MCV 87.7 80.0 - 100.0 fL   MCH 29.9 26.0 - 34.0 pg   MCHC 34.1 32.0 - 36.0 g/dL   RDW 13.5 11.5 - 14.5 %   Platelets 229 150 - 440 K/uL   Neutrophils Relative % 61 %   Neutro Abs 5.8 1.4 - 6.5 K/uL   Lymphocytes Relative 30 %   Lymphs Abs 2.9 1.0 - 3.6 K/uL   Monocytes Relative 7 %   Monocytes Absolute 0.6 0.2 - 1.0 K/uL  Eosinophils Relative 1 %   Eosinophils Absolute 0.1 0 - 0.7 K/uL   Basophils Relative 1 %   Basophils Absolute 0.1 0 - 0.1 K/uL  Comprehensive metabolic panel     Status: Abnormal   Collection Time: 11/14/16  9:49 AM  Result Value Ref Range   Sodium 137 135 - 145 mmol/L   Potassium 4.9 3.5 - 5.1 mmol/L   Chloride 104 101 - 111 mmol/L   CO2 28 22 - 32 mmol/L   Glucose, Bld 114 (H) 65 - 99 mg/dL   BUN 17 6 - 20 mg/dL   Creatinine, Ser 1.18 0.61 - 1.24 mg/dL   Calcium 10.1 8.9 - 10.3 mg/dL   Total Protein 7.5 6.5 - 8.1 g/dL   Albumin 4.1 3.5 - 5.0 g/dL   AST 43 (H) 15 - 41 U/L   ALT 15 (L) 17 - 63 U/L   Alkaline Phosphatase 68 38 - 126 U/L   Total Bilirubin 0.6 0.3 - 1.2 mg/dL   GFR calc non Af Amer >60 >60 mL/min   GFR calc Af Amer >60 >60 mL/min    Anion gap 5 5 - 15  Troponin I     Status: Abnormal   Collection Time: 11/14/16  9:49 AM  Result Value Ref Range   Troponin I 5.79 (HH) <0.03 ng/mL   Dg Chest Portable 1 View  Result Date: 11/14/2016 CLINICAL DATA:  Smoker with epigastric/anterior chest pain since yesterday afternoon. Denies any heart/lung disease EXAM: PORTABLE CHEST 1 VIEW COMPARISON:  None. FINDINGS: The heart size and mediastinal contours are within normal limits. Both lungs are clear. The visualized skeletal structures are unremarkable. IMPRESSION: No active disease. Electronically Signed   By: Nolon Nations M.D.   On: 11/14/2016 10:14     ASSESSMENT AND PLAN:Patient has ruled in for non-STEMI with elevated troponin of 5.79. Currently chest pain-free with no significant EKG changes to warrant taking the patient to the Cath Lab on Sunday. However due to non-STEMI will start the patient on Aggrastat. Also discussed risks and benefits with the patient in front of his mother and father who have agreed to undergo cardiac catheterization and possible PCI.  KHAN,SHAUKAT A

## 2016-11-14 NOTE — ED Provider Notes (Addendum)
[facility]    CSN: [id number] Arrival date & time: 11/14/16  0821     History   Chief Complaint Chief Complaint  Patient presents with  . Chest Pain   HPI  56 year old male with past history of tobacco abuse, hypertension, hyperlipidemia (currently untreated) presents with chest pain.  Patient reports that yesterday he was working on a gun and had sudden onset left-sided chest pain. He states that it felt like something was sitting on his chest. He states that it was 8-9 out of 10 in severity. Associated diaphoresis. His pain subsequently subsided after approximately 30 minutes. He elected not to go to the ER to be evaluated. This morning chest pain occurred again while he was walking his dog. He reports associated shortness of breath. He then decided to come in for evaluation. His current pain is 3-4 out of 10 in severity. Left-sided. No radiation. No current diaphoresis. He has a significant family history for coronary artery disease/heart disease. He has a past medical history of tobacco abuse and hypertension as well as hyperlipidemia.  Past Surgical History:  Procedure Laterality Date  . TONSILLECTOMY      Home Medications    Prior to Admission medications   Not on File    Family History Family History  Problem Relation Age of Onset  . Cancer Mother   . Hypertension Father   . CAD Father     Social History Social History  Substance Use Topics  . Smoking status: Current Every Day Smoker    Packs/day: 1.00    Types: Cigarettes  . Smokeless tobacco: Never Used  . Alcohol use No     Allergies   Codeine   Review of Systems Review of Systems  Constitutional: Positive for diaphoresis.  Respiratory: Positive for shortness of breath.   Cardiovascular: Positive for chest pain.  All other systems reviewed and are negative.  Physical Exam Triage Vital Signs ED Triage Vitals  Enc Vitals Group     BP 11/14/16 0828 (!) 194/87     Pulse Rate  11/14/16 0828 (!) 58     Resp 11/14/16 0828 18     Temp 11/14/16 0835 98 F (36.7 C)     Temp Source 11/14/16 0828 Oral     SpO2 11/14/16 0828 100 %     Weight 11/14/16 0831 170 lb (77.1 kg)     Height 11/14/16 0831 6' (1.829 m)     Head Circumference --      Peak Flow --      Pain Score 11/14/16 0832 3     Pain Loc --      Pain Edu? --      Excl. in [place]? --    Updated Vital Signs BP (!) 176/96 (BP Location: Right Arm)   Pulse (!) 59   Temp 98 F (36.7 C) (Oral)   Resp 18   Ht 6' (1.829 m)   Wt 170 lb (77.1 kg)   SpO2 100%   BMI 23.06 kg/m   Physical Exam  Constitutional: He is oriented to person, place, and time. He appears well-developed. No distress.  HENT:  Head: Normocephalic and atraumatic.  Eyes: Conjunctivae are normal.  Neck: Normal range of motion.  Cardiovascular: Normal rate and regular rhythm.   Pulmonary/Chest: Effort normal and breath sounds normal.  Abdominal: Soft. He exhibits no distension. There is no tenderness.  Musculoskeletal: Normal range of motion.  Neurological: He is alert and oriented to person, place, and time.  Skin: No rash noted.  Psychiatric: He has a normal mood and affect.  Vitals reviewed.  UC Treatments / Results  Labs (all labs ordered are listed, but only abnormal results are displayed) Labs Reviewed - No data to display  EKG   Date: 11/14/2016  EKG Time: 9:51 AM  Rate: 54  Rhythm: Sinus bradycardia  Axis:  Normal axis.  Intervals:Normal.  ST&T Change:  T-wave inversions in lead 2 and aVF.  Narrative Interpretation: sinus bradycardia at the rate of 54. T-wave inversions in lead 2 and aVF concerning for ischemia.  Radiology No results found.  Procedures Procedures (including critical care time)  Medications Ordered in UC Medications  aspirin chewable tablet 324 mg (324 mg Oral Given 11/14/16 0843)   Initial Impression / Assessment and Plan / UC Course  I have reviewed the triage vital signs and the nursing  notes.  Pertinent labs & imaging results that were available during my care of the patient were reviewed by me and considered in my medical decision making (see chart for details).    56 year old male presents with chest pain.  Appears to be cardiac in nature given history, and EKG findings concerning for ischemia with T-wave inversions in lead 2 and aVF. Aspirin given. Patient is being transported emergently to the ER for cardiology consultation/intervention.  Final Clinical Impressions(s) / UC Diagnoses   Final diagnoses:  Precordial pain    New Prescriptions There are no discharge medications for this patient.       Coral Spikes, DO 11/14/16 [phone]

## 2016-11-14 NOTE — Progress Notes (Addendum)
[facility] for Heparin/Tirofiban Indication: chest pain/ACS  Allergies  Allergen Reactions  . Codeine Other (See Comments)    Severe headache.    Patient Measurements: Height: 6' (182.9 cm) Weight: 170 lb (77.1 kg) IBW/kg (Calculated) : 77.6  Vital Signs: Temp: 97.7 F (36.5 C) (03/04 0939) Temp Source: Oral (03/04 0939) BP: 164/98 (03/04 1000) Pulse Rate: 53 (03/04 1000)  Labs:  Recent Labs  11/14/16 0949  HGB 14.6  HCT 42.8  PLT 229  CREATININE 1.18  TROPONINI 5.79*    Estimated Creatinine Clearance: 77.1 mL/min (by C-G formula based on SCr of 1.18 mg/dL).   Medical History: History reviewed. No pertinent past medical history.  Medications:   (Not in a hospital admission)  Assessment: 56 y/o M with a h/o tobacco abuse, HTN, and HLD admitted with CP. No anticoagulants PTA.   Goal of Therapy:  Heparin level 0.3-0.7 units/ml Monitor platelets by anticoagulation protocol: Yes   Plan:  Give 4000 units bolus x 1 Start heparin infusion at 950 units/hr Check anti-Xa level in 6 hours and daily while on heparin Continue to monitor H&H and platelets   Heparin drip discontinued and tirofiban ordered per pharmacy. Will order 25 mcg/kg bolus followed by infusion at 0.15 mcg/kg/min.   Ulice Dash D 11/14/2016,10:29 AM

## 2016-11-14 NOTE — Progress Notes (Signed)
Patient admitted to unit. Oriented to room, call bell, and staff. Bed in lowest position. Fall safety plan reviewed. Full assessment to Epic. Skin assessment verified with Particia Nearing RN. Telemetry box verification with tele clerk and Gerald Stabs NT- Box#: [id number]. Will continue to monitor.  Cardiac cath consent obtained and education handouts given. No questions or concerns at this time. Aggrastat gtt infusing upon admission to room. Family at bedside.

## 2016-11-14 NOTE — ED Notes (Signed)
This RN spoke with Dr. Humphrey Rolls over the phone regarding pt NPO status. Dr. Humphrey Rolls states pt is to be NPO starting at 0000 11/15/16 due to patient going to cath lab tomorrow at [place].

## 2016-11-14 NOTE — H&P (Signed)
[place] [facility] NAME: Gabriel Grant    MR#:  [id number]  DATE OF BIRTH:  1961/02/03  DATE OF ADMISSION:  11/14/2016  PRIMARY CARE PHYSICIAN: No primary care provider on file.   REQUESTING/REFERRING PHYSICIAN: robinson  CHIEF COMPLAINT:   Chief Complaint  Patient presents with  . Chest Pain    HISTORY OF PRESENT ILLNESS: Gabriel Grant  is a 56 y.o. male with a known history of Htn, Hyperlipidemia- was not taking meds for last 2-3 yrs as lost his insurance. Came with pressure like chest pain since last night, on-off. CAme to ER and found to have elevated troponin, Spoke to cardiologist and started on aggrastat drip and advised to have cath tomorrow.  PAST MEDICAL HISTORY:   Past Medical History:  Diagnosis Date  . Hyperlipidemia   . Hypertension     PAST SURGICAL HISTORY: Past Surgical History:  Procedure Laterality Date  . TONSILLECTOMY      SOCIAL HISTORY:  Social History  Substance Use Topics  . Smoking status: Current Every Day Smoker    Packs/day: 1.00    Types: Cigarettes  . Smokeless tobacco: Never Used  . Alcohol use No    FAMILY HISTORY:  Family History  Problem Relation Age of Onset  . Cancer Mother   . Hypertension Father   . CAD Father   . CAD Paternal Grandfather     DRUG ALLERGIES:  Allergies  Allergen Reactions  . Codeine Other (See Comments)    Severe headache.    REVIEW OF SYSTEMS:   CONSTITUTIONAL: No fever, fatigue or weakness.  EYES: No blurred or double vision.  EARS, NOSE, AND THROAT: No tinnitus or ear pain.  RESPIRATORY: No cough, shortness of breath, wheezing or hemoptysis.  CARDIOVASCULAR: positive for chest pain,no orthopnea, edema.  GASTROINTESTINAL: No nausea, vomiting, diarrhea or abdominal pain.  GENITOURINARY: No dysuria, hematuria.  ENDOCRINE: No polyuria, nocturia,  HEMATOLOGY: No anemia, easy bruising or bleeding SKIN: No rash or lesion. MUSCULOSKELETAL: No joint pain or  arthritis.   NEUROLOGIC: No tingling, numbness, weakness.  PSYCHIATRY: No anxiety or depression.   MEDICATIONS AT HOME:  Prior to Admission medications   Medication Sig Start Date End Date Taking? Authorizing Provider  diphenhydramine-acetaminophen (TYLENOL PM) 25-500 MG TABS tablet Take 1 tablet by mouth at bedtime.   Yes Historical Provider, MD  loratadine (CLARITIN) 10 MG tablet Take 10 mg by mouth daily.   Yes Historical Provider, MD  naproxen sodium (ANAPROX) 220 MG tablet Take 440 mg by mouth at bedtime.   Yes Historical Provider, MD      PHYSICAL EXAMINATION:   VITAL SIGNS: Blood pressure (!) 144/78, pulse (!) 54, temperature 97.8 F (36.6 C), temperature source Oral, resp. rate 18, height 6' (1.829 m), weight 77.1 kg (170 lb), SpO2 95 %.  GENERAL:  56 y.o.-year-old patient lying in the bed with no acute distress.  EYES: Pupils equal, round, reactive to light and accommodation. No scleral icterus. Extraocular muscles intact.  HEENT: Head atraumatic, normocephalic. Oropharynx and nasopharynx clear.  NECK:  Supple, no jugular venous distention. No thyroid enlargement, no tenderness.  LUNGS: Normal breath sounds bilaterally, no wheezing, rales,rhonchi or crepitation. No use of accessory muscles of respiration.  CARDIOVASCULAR: S1, S2 normal. No murmurs, rubs, or gallops.  ABDOMEN: Soft, nontender, nondistended. Bowel sounds present. No organomegaly or mass.  EXTREMITIES: No pedal edema, cyanosis, or clubbing.  NEUROLOGIC: Cranial nerves II through XII are intact. Muscle strength 5/5 in all  extremities. Sensation intact. Gait not checked.  PSYCHIATRIC: The patient is alert and oriented x 3.  SKIN: No obvious rash, lesion, or ulcer.   LABORATORY PANEL:   CBC  Recent Labs Lab 11/14/16 0949  WBC 9.4  HGB 14.6  HCT 42.8  PLT 229  MCV 87.7  MCH 29.9  MCHC 34.1  RDW 13.5  LYMPHSABS 2.9  MONOABS 0.6  EOSABS 0.1  BASOSABS 0.1    ------------------------------------------------------------------------------------------------------------------  Chemistries   Recent Labs Lab 11/14/16 0949  NA 137  K 4.9  CL 104  CO2 28  GLUCOSE 114*  BUN 17  CREATININE 1.18  CALCIUM 10.1  AST 43*  ALT 15*  ALKPHOS 68  BILITOT 0.6   ------------------------------------------------------------------------------------------------------------------ estimated creatinine clearance is 77.1 mL/min (by C-G formula based on SCr of 1.18 mg/dL). ------------------------------------------------------------------------------------------------------------------ No results for input(s): TSH, T4TOTAL, T3FREE, THYROIDAB in the last 72 hours.  Invalid input(s): FREET3   Coagulation profile  Recent Labs Lab 11/14/16 1120  INR 0.89   ------------------------------------------------------------------------------------------------------------------- No results for input(s): DDIMER in the last 72 hours. -------------------------------------------------------------------------------------------------------------------  Cardiac Enzymes  Recent Labs Lab 11/14/16 0949  TROPONINI 5.79*   ------------------------------------------------------------------------------------------------------------------ Invalid input(s): POCBNP  ---------------------------------------------------------------------------------------------------------------  Urinalysis No results found for: COLORURINE, APPEARANCEUR, LABSPEC, PHURINE, GLUCOSEU, HGBUR, BILIRUBINUR, KETONESUR, PROTEINUR, UROBILINOGEN, NITRITE, LEUKOCYTESUR   RADIOLOGY: Dg Chest Portable 1 View  Result Date: 11/14/2016 CLINICAL DATA:  Smoker with epigastric/anterior chest pain since yesterday afternoon. Denies any heart/lung disease EXAM: PORTABLE CHEST 1 VIEW COMPARISON:  None. FINDINGS: The heart size and mediastinal contours are within normal limits. Both lungs are clear. The  visualized skeletal structures are unremarkable. IMPRESSION: No active disease. Electronically Signed   By: Nolon Nations M.D.   On: 11/14/2016 10:14    EKG: Orders placed or performed during the hospital encounter of 11/14/16  . ED EKG  . ED EKG  . EKG 12-Lead  . EKG 12-Lead    IMPRESSION AND PLAN:  * NSTEMi   IV aggrastat drip, tele, troponin   Echo, cardio consult   Lipid panel, hemoglobin A1c.  * Hypertension   Start amlodipin   May need betablockers on d/c  * Hyperlipidemia   Statin.  * active smoking   Councelled to quit smoking for 4 min, offered nicotin patch.  All the records are reviewed and case discussed with ED provider. Management plans discussed with the patient, family and they are in agreement.  CODE STATUS: Full    Code Status Orders        Start     Ordered   11/14/16 1449  Full code  Continuous     11/14/16 1448    Code Status History    Date Active Date Inactive Code Status Order ID Comments User Context   This patient has a current code status but no historical code status.       TOTAL TIME TAKING CARE OF THIS PATIENT: 50 critical care minutes.    Vaughan Basta M.D on 11/14/2016   Between 7am to 6pm - Pager - [phone]  After 6pm go to [email or web address] - password [email or web address]  Office  [phone]  CC: Primary care physician; No primary care provider on file.   Note: This dictation was prepared with Dragon dictation along with smaller phrase technology. Any transcriptional errors that result from this process are unintentional.

## 2016-11-15 ENCOUNTER — Inpatient Hospital Stay
Admit: 2016-11-15 | Discharge: 2016-11-15 | Disposition: A | Discharge status: Home / Self Care | Payer: 59 | Attending: Internal Medicine | Admitting: Internal Medicine

## 2016-11-15 ENCOUNTER — Encounter
Admission: EM | Disposition: A | Discharge status: Home / Self Care | Payer: Self-pay | Source: Home / Self Care | Attending: Internal Medicine

## 2016-11-15 HISTORY — PX: LEFT HEART CATH AND CORONARY ANGIOGRAPHY: CATH118249

## 2016-11-15 HISTORY — PX: CORONARY STENT INTERVENTION: CATH118234

## 2016-11-15 LAB — TROPONIN I: Troponin I: 4.64 ng/mL (ref <0.03)

## 2016-11-15 LAB — HEMOGLOBIN A1C
Hgb A1c MFr Bld: 5.6 % (ref 4.8–5.6)
Mean Plasma Glucose: 114 mg/dL

## 2016-11-15 LAB — BASIC METABOLIC PANEL
Anion gap: 5 (ref 5–15)
BUN: 16 mg/dL (ref 6–20)
CHLORIDE: 105 mmol/L (ref 101–111)
CO2: 25 mmol/L (ref 22–32)
Calcium: 10.1 mg/dL (ref 8.9–10.3)
Creatinine, Ser: 1.19 mg/dL (ref 0.61–1.24)
GFR calc non Af Amer: >60 mL/min (ref >60)
Glucose, Bld: 102 mg/dL — ABNORMAL HIGH (ref 65–99)
POTASSIUM: 4.3 mmol/L (ref 3.5–5.1)
SODIUM: 135 mmol/L (ref 135–145)

## 2016-11-15 LAB — CBC
HCT: 42.4 % (ref 40.0–52.0)
Hemoglobin: 14.4 g/dL (ref 13.0–18.0)
MCH: 30.1 pg (ref 26.0–34.0)
MCHC: 34.1 g/dL (ref 32.0–36.0)
MCV: 88.5 fL (ref 80.0–100.0)
Platelets: 239 10*3/uL (ref 150–440)
RBC: 4.79 MIL/uL (ref 4.40–5.90)
RDW: 13.5 % (ref 11.5–14.5)
WBC: 10 10*3/uL (ref 3.8–10.6)

## 2016-11-15 LAB — ECHOCARDIOGRAM COMPLETE
HEIGHTINCHES: 72 in
WEIGHTICAEL: 2720 [oz_av]

## 2016-11-15 LAB — POCT ACTIVATED CLOTTING TIME: Activated Clotting Time: 334 seconds

## 2016-11-15 SURGERY — LEFT HEART CATH AND CORONARY ANGIOGRAPHY
Anesthesia: Moderate Sedation | Laterality: Right

## 2016-11-15 MED ORDER — ASPIRIN 81 MG PO CHEW
81.0000 mg | CHEWABLE_TABLET | Freq: Every day | ORAL | Status: DC
  Filled 2016-11-15: qty 1

## 2016-11-15 MED ORDER — ACETAMINOPHEN 325 MG PO TABS
650.0000 mg | ORAL_TABLET | ORAL | Status: DC | PRN
  Administered 2016-11-15: 650 mg via ORAL

## 2016-11-15 MED ORDER — HYDRALAZINE HCL 20 MG/ML IJ SOLN
INTRAMUSCULAR | Status: DC | PRN
  Administered 2016-11-15: 20 mg via INTRAVENOUS

## 2016-11-15 MED ORDER — BIVALIRUDIN 250 MG IV SOLR
INTRAVENOUS | Status: AC
  Filled 2016-11-15: qty 250

## 2016-11-15 MED ORDER — NITROGLYCERIN 1 MG/10 ML FOR IR/CATH LAB
INTRA_ARTERIAL | Status: DC | PRN
  Administered 2016-11-15: 200 ug

## 2016-11-15 MED ORDER — LABETALOL HCL 5 MG/ML IV SOLN: 10.0000 mg | INTRAVENOUS | Status: AC | PRN

## 2016-11-15 MED ORDER — HYDRALAZINE HCL 20 MG/ML IJ SOLN
INTRAMUSCULAR | Status: AC
  Filled 2016-11-15: qty 1

## 2016-11-15 MED ORDER — SODIUM CHLORIDE 0.9 % IV SOLN
0.2500 mg/kg/h | INTRAVENOUS | Status: AC
  Filled 2016-11-15: qty 250

## 2016-11-15 MED ORDER — HEPARIN (PORCINE) IN NACL 2-0.9 UNIT/ML-% IJ SOLN
INTRAMUSCULAR | Status: AC
  Filled 2016-11-15: qty 500

## 2016-11-15 MED ORDER — MIDAZOLAM HCL 2 MG/2ML IJ SOLN
INTRAMUSCULAR | Status: DC | PRN
  Administered 2016-11-15: 1 mg via INTRAVENOUS
  Administered 2016-11-15: 0.5 mg via INTRAVENOUS

## 2016-11-15 MED ORDER — BIVALIRUDIN BOLUS VIA INFUSION - CUPID
INTRAVENOUS | Status: DC | PRN
  Administered 2016-11-15: 57.825 mg via INTRAVENOUS

## 2016-11-15 MED ORDER — IOPAMIDOL (ISOVUE-300) INJECTION 61%
INTRAVENOUS | Status: DC | PRN
  Administered 2016-11-15: 165 mL via INTRA_ARTERIAL

## 2016-11-15 MED ORDER — MORPHINE SULFATE (PF) 2 MG/ML IV SOLN
2.0000 mg | Freq: Once | INTRAVENOUS | Status: AC
  Administered 2016-11-15: 2 mg via INTRAVENOUS

## 2016-11-15 MED ORDER — MORPHINE SULFATE (PF) 2 MG/ML IV SOLN
INTRAVENOUS | Status: AC
  Administered 2016-11-15: 2 mg via INTRAVENOUS
  Filled 2016-11-15: qty 1

## 2016-11-15 MED ORDER — SODIUM CHLORIDE 0.9% FLUSH
3.0000 mL | Freq: Two times a day (BID) | INTRAVENOUS | Status: DC
  Administered 2016-11-16: 3 mL via INTRAVENOUS

## 2016-11-15 MED ORDER — SODIUM CHLORIDE 0.9 % IV SOLN: 250.0000 mL | INTRAVENOUS | Status: DC | PRN

## 2016-11-15 MED ORDER — HYDRALAZINE HCL 20 MG/ML IJ SOLN: 5.0000 mg | INTRAMUSCULAR | Status: AC | PRN

## 2016-11-15 MED ORDER — TICAGRELOR 90 MG PO TABS
ORAL_TABLET | ORAL | Status: AC
  Filled 2016-11-15: qty 2

## 2016-11-15 MED ORDER — SODIUM CHLORIDE 0.9% FLUSH: 3.0000 mL | INTRAVENOUS | Status: DC | PRN

## 2016-11-15 MED ORDER — NICOTINE 21 MG/24HR TD PT24
21.0000 mg | MEDICATED_PATCH | Freq: Every day | TRANSDERMAL | Status: DC
  Administered 2016-11-16: 21 mg via TRANSDERMAL
  Filled 2016-11-15: qty 1

## 2016-11-15 MED ORDER — TICAGRELOR 90 MG PO TABS
ORAL_TABLET | ORAL | Status: DC | PRN
  Administered 2016-11-15: 180 mg via ORAL

## 2016-11-15 MED ORDER — FENTANYL CITRATE (PF) 100 MCG/2ML IJ SOLN
INTRAMUSCULAR | Status: AC
  Filled 2016-11-15: qty 2

## 2016-11-15 MED ORDER — ONDANSETRON HCL 4 MG/2ML IJ SOLN
INTRAMUSCULAR | Status: AC
  Administered 2016-11-15: 4 mg via INTRAVENOUS
  Filled 2016-11-15: qty 2

## 2016-11-15 MED ORDER — TICAGRELOR 90 MG PO TABS
90.0000 mg | ORAL_TABLET | Freq: Two times a day (BID) | ORAL | Status: DC
  Administered 2016-11-16: 90 mg via ORAL
  Filled 2016-11-15 (×2): qty 1

## 2016-11-15 MED ORDER — ONDANSETRON HCL 4 MG/2ML IJ SOLN
4.0000 mg | Freq: Four times a day (QID) | INTRAMUSCULAR | Status: DC | PRN
  Administered 2016-11-15: 4 mg via INTRAVENOUS

## 2016-11-15 MED ORDER — NITROGLYCERIN 5 MG/ML IV SOLN
INTRAVENOUS | Status: AC
Start: 2016-11-15 — End: 2016-11-15
  Filled 2016-11-15: qty 10

## 2016-11-15 MED ORDER — MIDAZOLAM HCL 2 MG/2ML IJ SOLN
INTRAMUSCULAR | Status: AC
  Filled 2016-11-15: qty 2

## 2016-11-15 MED ORDER — ATORVASTATIN CALCIUM 20 MG PO TABS
40.0000 mg | ORAL_TABLET | Freq: Every day | ORAL | Status: DC
  Administered 2016-11-15: 40 mg via ORAL
  Filled 2016-11-15: qty 2

## 2016-11-15 MED ORDER — FENTANYL CITRATE (PF) 100 MCG/2ML IJ SOLN
INTRAMUSCULAR | Status: DC | PRN
  Administered 2016-11-15: 50 ug via INTRAVENOUS
  Administered 2016-11-15: 25 ug via INTRAVENOUS

## 2016-11-15 MED ORDER — TIROFIBAN HCL IN NACL 5-0.9 MG/100ML-% IV SOLN
INTRAVENOUS | Status: AC
  Filled 2016-11-15: qty 100

## 2016-11-15 MED ORDER — IPRATROPIUM-ALBUTEROL 0.5-2.5 (3) MG/3ML IN SOLN
3.0000 mL | Freq: Four times a day (QID) | RESPIRATORY_TRACT | Status: DC
  Administered 2016-11-15 – 2016-11-16 (×3): 3 mL via RESPIRATORY_TRACT
  Filled 2016-11-15 (×4): qty 3

## 2016-11-15 MED ORDER — ACETAMINOPHEN 325 MG PO TABS
ORAL_TABLET | ORAL | Status: AC
  Filled 2016-11-15: qty 2

## 2016-11-15 MED ORDER — IOPAMIDOL (ISOVUE-300) INJECTION 61%
INTRAVENOUS | Status: DC | PRN
  Administered 2016-11-15: 110 mL via INTRA_ARTERIAL

## 2016-11-15 MED ORDER — BIVALIRUDIN 250 MG IV SOLR
INTRAVENOUS | Status: DC | PRN
  Administered 2016-11-15: 1.75 mg/kg/h via INTRAVENOUS

## 2016-11-15 MED ORDER — SODIUM CHLORIDE 0.9 % WEIGHT BASED INFUSION
1.0000 mL/kg/h | INTRAVENOUS | Status: AC
  Administered 2016-11-15: 1 mL/kg/h via INTRAVENOUS

## 2016-11-15 SURGICAL SUPPLY — 18 items
BALLN TREK RX 2.5X15 (BALLOONS) ×3
BALLOON TREK RX 2.5X15 (BALLOONS) IMPLANT
CATH INFINITI 5FR ANG PIGTAIL (CATHETERS) ×1 IMPLANT
CATH INFINITI 5FR JL4 (CATHETERS) ×1 IMPLANT
CATH INFINITI JR4 5F (CATHETERS) ×1 IMPLANT
CATH VISTA GUIDE 6FR JR4 (CATHETERS) ×1 IMPLANT
DEVICE CLOSURE MYNXGRIP 6/7F (Vascular Products) ×1 IMPLANT
DEVICE INFLAT 30 PLUS (MISCELLANEOUS) ×1 IMPLANT
KIT MANI 3VAL PERCEP (MISCELLANEOUS) ×3 IMPLANT
NDL PERC 18GX7CM (NEEDLE) IMPLANT
NEEDLE PERC 18GX7CM (NEEDLE) ×3 IMPLANT
PACK CARDIAC CATH (CUSTOM PROCEDURE TRAY) ×3 IMPLANT
SHEATH AVANTI 6FR X 11CM (SHEATH) ×1 IMPLANT
SHEATH PINNACLE 5F 10CM (SHEATH) ×1 IMPLANT
STENT XIENCE ALPINE RX 2.5X15 (Permanent Stent) ×1 IMPLANT
WIRE EMERALD 3MM-J .035X150CM (WIRE) ×1 IMPLANT
WIRE G HI TQ BMW 190 (WIRE) ×1 IMPLANT
WIRE HI TORQ TRAVERSE ST 190CM (WIRE) ×1 IMPLANT

## 2016-11-15 NOTE — Progress Notes (Signed)
Discontinue nitro paste per Dr. Humphrey Rolls.

## 2016-11-15 NOTE — Progress Notes (Signed)
SUBJECTIVE: Patient has no further chest pain   Vitals:   11/15/16 0414 11/15/16 0831 11/15/16 1002 11/15/16 1106  BP: (!) 146/79  (!) 154/93 (!) 146/108  Pulse: (!) 59  66 70  Resp: 18   18  Temp: 98.1 F (36.7 C)   98.3 F (36.8 C)  TempSrc: Oral   Oral  SpO2: 96% 96%  96%  Weight:      Height:        Intake/Output Summary (Last 24 hours) at 11/15/16 1131 Last data filed at 11/15/16 0416  Gross per 24 hour  Intake           339.94 ml  Output              875 ml  Net          -535.06 ml    LABS: Basic Metabolic Panel:  Recent Labs  11/14/16 2319 11/15/16 0424  NA 135 135  K 4.0 4.3  CL 105 105  CO2 25 25  GLUCOSE 99 102*  BUN 16 16  CREATININE 1.15 1.19  CALCIUM 9.6 10.1   Liver Function Tests:  Recent Labs  11/14/16 0949  AST 43*  ALT 15*  ALKPHOS 68  BILITOT 0.6  PROT 7.5  ALBUMIN 4.1   No results for input(s): LIPASE, AMYLASE in the last 72 hours. CBC:  Recent Labs  11/14/16 0949 11/14/16 2319 11/15/16 0424  WBC 9.4 9.6 10.0  NEUTROABS 5.8  --   --   HGB 14.6 14.0 14.4  HCT 42.8 40.8 42.4  MCV 87.7 88.7 88.5  PLT 229 220 239   Cardiac Enzymes:  Recent Labs  11/14/16 1513 11/14/16 1933 11/14/16 2319  TROPONINI 5.14* 5.24* 4.64*   BNP: Invalid input(s): POCBNP D-Dimer: No results for input(s): DDIMER in the last 72 hours. Hemoglobin A1C:  Recent Labs  11/14/16 1513  HGBA1C 5.6   Fasting Lipid Panel:  Recent Labs  11/14/16 1513  CHOL 180  HDL 52  LDLCALC 105*  TRIG 117  CHOLHDL 3.5   Thyroid Function Tests: No results for input(s): TSH, T4TOTAL, T3FREE, THYROIDAB in the last 72 hours.  Invalid input(s): FREET3 Anemia Panel: No results for input(s): VITAMINB12, FOLATE, FERRITIN, TIBC, IRON, RETICCTPCT in the last 72 hours.   PHYSICAL EXAM General: Well developed, well nourished, in no acute distress HEENT:  Normocephalic and atramatic Neck:  No JVD.  Lungs: Clear bilaterally to auscultation and  percussion. Heart: HRRR . Normal S1 and S2 without gallops or murmurs.  Abdomen: Bowel sounds are positive, abdomen soft and non-tender  Msk:  Back normal, normal gait. Normal strength and tone for age. Extremities: No clubbing, cyanosis or edema.   Neuro: Alert and oriented X 3. Psych:  Good affect, responds appropriately  TELEMETRY: NSR  ASSESSMENT AND PLAN:NSTEMI , patient has troponins ttrending and LVEF is still normal with WMA. Will be doing cath today.  Principal Problem:   Acute MI Active Problems:   NSTEMI (non-ST elevated myocardial infarction) ([place])    Neoma Laming A, MD, [facility] 11/15/2016 11:31 AM

## 2016-11-15 NOTE — Progress Notes (Signed)
Patient ID: Gabriel Grant, male   DOB: [birth year], 56 y.o.   MRN: [id number]  [employer] PROGRESS NOTE  ROCHELLE WAJDA [id number] DOB: 10/23/1960 DOA: 11/14/2016 PCP: No primary care provider on file.  HPI/Subjective: Patient thinks his heart attack started on Saturday afternoon where he felt bed with chest pain and shortness of breath which lasts about 15-20 minutes.he felt tired afterwards. On Sunday still not feeling that well and canceled his golf and decided to get checked out at the urgent care center sent him to the hospital. He was found with an elevated troponin.  Objective: Vitals:   11/14/16 1923 11/15/16 0414  BP: 117/71 (!) 146/79  Pulse: 68 (!) 59  Resp:  18  Temp: 98.4 F (36.9 C) 98.1 F (36.7 C)    Filed Weights   11/14/16 0943  Weight: 77.1 kg (170 lb)    ROS: Review of Systems  Constitutional: Negative for chills and fever.  Eyes: Negative for blurred vision.  Respiratory: Negative for cough and shortness of breath.   Cardiovascular: Negative for chest pain.  Gastrointestinal: Negative for abdominal pain, constipation, diarrhea, nausea and vomiting.  Genitourinary: Negative for dysuria.  Musculoskeletal: Negative for joint pain.  Neurological: Negative for dizziness and headaches.   Exam: Physical Exam  Constitutional: He is oriented to person, place, and time.  HENT:  Nose: No mucosal edema.  Mouth/Throat: No oropharyngeal exudate or posterior oropharyngeal edema.  Eyes: Conjunctivae, EOM and lids are normal. Pupils are equal, round, and reactive to light.  Neck: No JVD present. Carotid bruit is not present. No edema present. No thyroid mass and no thyromegaly present.  Cardiovascular: S1 normal and S2 normal.  Exam reveals no gallop.   No murmur heard. Pulses:      Dorsalis pedis pulses are 2+ on the right side, and 2+ on the left side.  Respiratory: No respiratory distress. He has decreased breath sounds in the right middle field, the right  lower field and the left lower field. He has wheezes in the right middle field, the right lower field, the left middle field and the left lower field. He has no rhonchi. He has no rales.  GI: Soft. Bowel sounds are normal. There is no tenderness.  Musculoskeletal:       Right ankle: He exhibits no swelling.       Left ankle: He exhibits no swelling.  Lymphadenopathy:    He has no cervical adenopathy.  Neurological: He is alert and oriented to person, place, and time. No cranial nerve deficit.  Skin: Skin is warm. No rash noted. Nails show no clubbing.  Psychiatric: He has a normal mood and affect.      Data Reviewed: Basic Metabolic Panel:  Recent Labs Lab 11/14/16 0949 11/14/16 2319 11/15/16 0424  NA 137 135 135  K 4.9 4.0 4.3  CL 104 105 105  CO2 28 25 25   GLUCOSE 114* 99 102*  BUN 17 16 16   CREATININE 1.18 1.15 1.19  CALCIUM 10.1 9.6 10.1   Liver Function Tests:  Recent Labs Lab 11/14/16 0949  AST 43*  ALT 15*  ALKPHOS 68  BILITOT 0.6  PROT 7.5  ALBUMIN 4.1   CBC:  Recent Labs Lab 11/14/16 0949 11/14/16 2319 11/15/16 0424  WBC 9.4 9.6 10.0  NEUTROABS 5.8  --   --   HGB 14.6 14.0 14.4  HCT 42.8 40.8 42.4  MCV 87.7 88.7 88.5  PLT 229 220 239   Cardiac Enzymes:  Recent Labs  Lab 11/14/16 0949 11/14/16 1513 11/14/16 1933 11/14/16 2319  TROPONINI 5.79* 5.14* 5.24* 4.64*     Studies: Dg Chest Portable 1 View  Result Date: 11/14/2016 CLINICAL DATA:  Smoker with epigastric/anterior chest pain since yesterday afternoon. Denies any heart/lung disease EXAM: PORTABLE CHEST 1 VIEW COMPARISON:  None. FINDINGS: The heart size and mediastinal contours are within normal limits. Both lungs are clear. The visualized skeletal structures are unremarkable. IMPRESSION: No active disease. Electronically Signed   By: Nolon Nations M.D.   On: 11/14/2016 10:14    Scheduled Meds: . acetaminophen  500 mg Oral QHS   And  . diphenhydrAMINE  25 mg Oral QHS  .  amLODipine  5 mg Oral Daily  . aspirin EC  325 mg Oral Daily  . atorvastatin  40 mg Oral q1800  . ipratropium-albuterol  3 mL Nebulization Q6H  . loratadine  10 mg Oral QPM  . nicotine  21 mg Transdermal Daily  . nitroGLYCERIN  0.5 inch Topical Q6H  . sodium chloride flush  3 mL Intravenous Q12H  . sodium chloride flush  3 mL Intravenous Q12H   Continuous Infusions: . sodium chloride      Assessment/Plan:  1. NSTEMI. Patient on aspirin. Heart rate borderline at this time and unable to give beta blocker at this point. Patient also on nitroglycerin patch. Patient to have a cardiac catheterization today. And atorvastatin 40 mg daily 2. Essential hypertension on Norvasc 3. Hyperlipidemia unspecified LDL 105: Less than 70. Start Lipitor 40 mg daily 4. Tobacco abuse. Start DuoNeb nebulizer solution. Patient already on nicotine patch. Tobacco abuse counseling done 4 minutes by me.  Code Status:     Code Status Orders        Start     Ordered   11/14/16 1449  Full code  Continuous     11/14/16 1448    Code Status History    Date Active Date Inactive Code Status Order ID Comments User Context   This patient has a current code status but no historical code status.     Family Communication: wife at the bedside Disposition Plan: depending on results of cardiac catheterization  Consultants:  Dr. Humphrey Rolls  Time spent: 25 minutes now  [place], [place]

## 2016-11-15 NOTE — Progress Notes (Signed)
Patient back from PCI. Resting quietly, no complaints. Family at bedside. PAD to right groin remains inflated at 40cc. Per [facility] RN, Coralyn Mark, can attempt to deflate halfway at bedtime and remaining amount in the morning. Will monitor closely as patient was on angiomax drip while in [facility]. Spoke with pharmacist - patient is still ok to receive first dose of brilinta tonight as scheduled. Patient was able to sit up at 1645, but reminded to stay in bed and limit mobility while PAD is in place.

## 2016-11-15 NOTE — Progress Notes (Signed)
*  PRELIMINARY RESULTS* Echocardiogram 2D Echocardiogram has been performed.  Sherrie Sport 11/15/2016, 9:47 AM

## 2016-11-15 NOTE — Care Management (Signed)
Admitted to [facility] with the diagnosis of acute MI. Lives with wife, Darnelle Bos [phone]). Seen Dr. Ellison Hughs in the past as primary care physician. Seen at [facility] prior to presenting to the emergency room. Scheduled for Cardiac Catheterization today. No needs identified at present [place] Management

## 2016-11-16 ENCOUNTER — Encounter: Payer: Self-pay | Admitting: Cardiovascular Disease

## 2016-11-16 MED ORDER — METOPROLOL SUCCINATE ER 25 MG PO TB24: 25.0000 mg | ORAL_TABLET | Freq: Every day | ORAL | 0 refills | Status: DC

## 2016-11-16 MED ORDER — NITROGLYCERIN 0.4 MG SL SUBL: 0.4000 mg | SUBLINGUAL_TABLET | SUBLINGUAL | 0 refills | Status: AC | PRN

## 2016-11-16 MED ORDER — ASPIRIN 81 MG PO TBEC: 81.0000 mg | DELAYED_RELEASE_TABLET | Freq: Every day | ORAL | 0 refills | Status: AC

## 2016-11-16 MED ORDER — ASPIRIN EC 81 MG PO TBEC
81.0000 mg | DELAYED_RELEASE_TABLET | Freq: Every day | ORAL | Status: DC
  Administered 2016-11-16: 81 mg via ORAL

## 2016-11-16 MED ORDER — TICAGRELOR 90 MG PO TABS: 90.0000 mg | ORAL_TABLET | Freq: Two times a day (BID) | ORAL | 0 refills | Status: DC

## 2016-11-16 MED ORDER — METOPROLOL SUCCINATE ER 25 MG PO TB24
25.0000 mg | ORAL_TABLET | Freq: Every day | ORAL | Status: DC
  Administered 2016-11-16: 25 mg via ORAL
  Filled 2016-11-16: qty 1

## 2016-11-16 MED ORDER — NICOTINE 21 MG/24HR TD PT24: 21.0000 mg | MEDICATED_PATCH | Freq: Every day | TRANSDERMAL | 0 refills | Status: DC

## 2016-11-16 MED ORDER — ATORVASTATIN CALCIUM 40 MG PO TABS: 40.0000 mg | ORAL_TABLET | Freq: Every day | ORAL | 0 refills | Status: DC

## 2016-11-16 NOTE — Discharge Summary (Signed)
[place] [facility] [place] NAME: Gabriel Grant    MR#:  [id number]  DATE OF BIRTH:  1961/06/21  DATE OF ADMISSION:  11/14/2016 ADMITTING PHYSICIAN: Vaughan Basta, MD  DATE OF DISCHARGE: 11/16/2016 11:45 AM  PRIMARY CARE PHYSICIAN: Dr Thereasa Distance   ADMISSION DIAGNOSIS:  Shortness of breath [R06.02] Elevated troponin I level [R74.8] Chest pain, unspecified type [R07.9]  DISCHARGE DIAGNOSIS:  Principal Problem:   Acute MI Active Problems:   NSTEMI (non-ST elevated myocardial infarction) ([place])   SECONDARY DIAGNOSIS:   Past Medical History:  Diagnosis Date  . Hyperlipidemia   . Hypertension     HOSPITAL COURSE:   1. NSTEMI. The patient was taken to the cardiac catheter lab and had a distal RCA lesion 100% stenosis which was successful PCI and stent. The patient felt well post procedure. Patient prescribed aspirin, Brilinta, Lipitor and low-dose Toprol-XL. Initially we were unable to do beta blocker secondary to low heart rate. But heart rate improved after the procedure and able to do a low dose Toprol. Note for out of work for 1 week. 2. Essential hypertension. Switched Norvasc over to Toprol 3. Hyperlipidemia unspecified with an LDL of 105. Goal less than 70. Continue Lipitor that was started here in the hospital. 4. Tobacco abuse urine nicotine patch prescribed upon discharge. Patient's lungs were clear upon discharge home. Prior to the cardiac catheter he did have a slight wheeze. Patient was given nebulizer treatments here.  DISCHARGE CONDITIONS:   Satisfactory  CONSULTS OBTAINED:  Treatment Team:  Dionisio David, MD  DRUG ALLERGIES:   Allergies  Allergen Reactions  . Codeine Other (See Comments)    Severe headache.    DISCHARGE MEDICATIONS:   Discharge Medication List as of 11/16/2016 11:04 AM    START taking these medications   Details  aspirin EC 81 MG EC tablet Take 1 tablet (81 mg total) by mouth daily.,  Starting Tue 11/16/2016, Print    atorvastatin (LIPITOR) 40 MG tablet Take 1 tablet (40 mg total) by mouth daily at 6 PM., Starting Tue 11/16/2016, Print    metoprolol succinate (TOPROL-XL) 25 MG 24 hr tablet Take 1 tablet (25 mg total) by mouth daily., Starting Tue 11/16/2016, Print    nicotine (NICODERM CQ - DOSED IN MG/24 HOURS) 21 mg/24hr patch Place 1 patch (21 mg total) onto the skin daily., Starting Tue 11/16/2016, Print    nitroGLYCERIN (NITROSTAT) 0.4 MG SL tablet Place 1 tablet (0.4 mg total) under the tongue every 5 (five) minutes as needed for chest pain., Starting Tue 11/16/2016, Print    ticagrelor (BRILINTA) 90 MG TABS tablet Take 1 tablet (90 mg total) by mouth 2 (two) times daily., Starting Tue 11/16/2016, Print      CONTINUE these medications which have NOT CHANGED   Details  diphenhydramine-acetaminophen (TYLENOL PM) 25-500 MG TABS tablet Take 1 tablet by mouth at bedtime., Historical Med    loratadine (CLARITIN) 10 MG tablet Take 10 mg by mouth daily., Historical Med      STOP taking these medications     naproxen sodium (ANAPROX) 220 MG tablet          DISCHARGE INSTRUCTIONS:   Follow-up with cardiology on Friday Follow-up with PMD in 2 weeks  If you experience worsening of your admission symptoms, develop shortness of breath, life threatening emergency, suicidal or homicidal thoughts you must seek medical attention immediately by calling 911 or calling your MD immediately  if symptoms less severe.  You Must read complete instructions/literature along with all the possible adverse reactions/side effects for all the Medicines you take and that have been prescribed to you. Take any new Medicines after you have completely understood and accept all the possible adverse reactions/side effects.   Please note  You were cared for by a hospitalist during your hospital stay. If you have any questions about your discharge medications or the care you received while you were in the  hospital after you are discharged, you can call the unit and asked to speak with the hospitalist on call if the hospitalist that took care of you is not available. Once you are discharged, your primary care physician will handle any further medical issues. Please note that NO REFILLS for any discharge medications will be authorized once you are discharged, as it is imperative that you return to your primary care physician (or establish a relationship with a primary care physician if you do not have one) for your aftercare needs so that they can reassess your need for medications and monitor your lab values.    Today   CHIEF COMPLAINT:   Chief Complaint  Patient presents with  . Chest Pain    HISTORY OF PRESENT ILLNESS:  Gabriel Grant  is a 56 y.o. male presented with chest pain and found to have an NSTEMI   VITAL SIGNS:  Blood pressure 139/84, pulse 72, temperature 98.1 F (36.7 C), temperature source Oral, resp. rate 18, height 6' (1.829 m), weight 77.1 kg (170 lb), SpO2 97 %.    PHYSICAL EXAMINATION:  GENERAL:  56 y.o.-year-old patient lying in the bed with no acute distress.  EYES: Pupils equal, round, reactive to light and accommodation. No scleral icterus. Extraocular muscles intact.  HEENT: Head atraumatic, normocephalic. Oropharynx and nasopharynx clear.  NECK:  Supple, no jugular venous distention. No thyroid enlargement, no tenderness.  LUNGS: Normal breath sounds bilaterally, no wheezing, rales,rhonchi or crepitation. No use of accessory muscles of respiration.  CARDIOVASCULAR: S1, S2 normal. No murmurs, rubs, or gallops.  ABDOMEN: Soft, non-tender, non-distended. Bowel sounds present. No organomegaly or mass.  EXTREMITIES: No pedal edema, cyanosis, or clubbing.  NEUROLOGIC: Cranial nerves II through XII are intact. Muscle strength 5/5 in all extremities. Sensation intact. Gait not checked.  PSYCHIATRIC: The patient is alert and oriented x 3.  SKIN: No obvious rash, lesion,  or ulcer.   DATA REVIEW:   CBC  Recent Labs Lab 11/15/16 0424  WBC 10.0  HGB 14.4  HCT 42.4  PLT 239    Chemistries   Recent Labs Lab 11/14/16 0949  11/15/16 0424  NA 137  < > 135  K 4.9  < > 4.3  CL 104  < > 105  CO2 28  < > 25  GLUCOSE 114*  < > 102*  BUN 17  < > 16  CREATININE 1.18  < > 1.19  CALCIUM 10.1  < > 10.1  AST 43*  --   --   ALT 15*  --   --   ALKPHOS 68  --   --   BILITOT 0.6  --   --   < > = values in this interval not displayed.  Cardiac Enzymes  Recent Labs Lab 11/14/16 2319  TROPONINI 4.64*    Management plans discussed with the patient, family and they are in agreement.  CODE STATUS:  Code Status History    Date Active Date Inactive Code Status Order ID Comments User Context   11/14/2016  2:48 PM 11/16/2016  2:50 PM Full Code [id number]  Vaughan Basta, MD Inpatient      TOTAL TIME TAKING CARE OF THIS PATIENT: 35 minutes.    [name] M.D on 11/16/2016 at 5:14 PM  Between 7am to 6pm - Pager - [phone]  After 6pm go to [email or web address] - password [email or web address]  [employer] Office  [phone]  CC: Primary care physician; Dr. Thereasa Distance

## 2016-11-16 NOTE — Progress Notes (Signed)
SUBJECTIVE:Doing well   Vitals:   11/15/16 1937 11/15/16 2015 11/16/16 0628 11/16/16 0730  BP: (!) 168/82  139/84   Pulse: 85  72   Resp:   18   Temp:   98.1 F (36.7 C)   TempSrc:   Oral   SpO2: 98% 98% 97% 97%  Weight:      Height:        Intake/Output Summary (Last 24 hours) at 11/16/16 1000 Last data filed at 11/16/16 [id number]  Gross per 24 hour  Intake           772.29 ml  Output             2300 ml  Net         -1527.71 ml    LABS: Basic Metabolic Panel:  Recent Labs  11/14/16 2319 11/15/16 0424  NA 135 135  K 4.0 4.3  CL 105 105  CO2 25 25  GLUCOSE 99 102*  BUN 16 16  CREATININE 1.15 1.19  CALCIUM 9.6 10.1   Liver Function Tests:  Recent Labs  11/14/16 0949  AST 43*  ALT 15*  ALKPHOS 68  BILITOT 0.6  PROT 7.5  ALBUMIN 4.1   No results for input(s): LIPASE, AMYLASE in the last 72 hours. CBC:  Recent Labs  11/14/16 0949 11/14/16 2319 11/15/16 0424  WBC 9.4 9.6 10.0  NEUTROABS 5.8  --   --   HGB 14.6 14.0 14.4  HCT 42.8 40.8 42.4  MCV 87.7 88.7 88.5  PLT 229 220 239   Cardiac Enzymes:  Recent Labs  11/14/16 1513 11/14/16 1933 11/14/16 2319  TROPONINI 5.14* 5.24* 4.64*   BNP: Invalid input(s): POCBNP D-Dimer: No results for input(s): DDIMER in the last 72 hours. Hemoglobin A1C:  Recent Labs  11/14/16 1513  HGBA1C 5.6   Fasting Lipid Panel:  Recent Labs  11/14/16 1513  CHOL 180  HDL 52  LDLCALC 105*  TRIG 117  CHOLHDL 3.5   Thyroid Function Tests: No results for input(s): TSH, T4TOTAL, T3FREE, THYROIDAB in the last 72 hours.  Invalid input(s): FREET3 Anemia Panel: No results for input(s): VITAMINB12, FOLATE, FERRITIN, TIBC, IRON, RETICCTPCT in the last 72 hours.   PHYSICAL EXAM General: Well developed, well nourished, in no acute distress HEENT:  Normocephalic and atramatic Neck:  No JVD.  Lungs: Clear bilaterally to auscultation and percussion. Heart: HRRR . Normal S1 and S2 without gallops or murmurs.   Abdomen: Bowel sounds are positive, abdomen soft and non-tender  Msk:  Back normal, normal gait. Normal strength and tone for age. Extremities: No clubbing, cyanosis or edema.   Neuro: Alert and oriented X 3. Psych:  Good affect, responds appropriately  TELEMETRY: NSR  ASSESSMENT AND PLAN: s/p PCI Distal RCA DES, may go home with f/u Friday at 10 am.  Principal Problem:   Acute MI Active Problems:   NSTEMI (non-ST elevated myocardial infarction) ([place])    Arcenio Mullaly A, MD, [facility] 11/16/2016 10:00 AM

## 2016-11-16 NOTE — Care Management (Signed)
Discharge to home today per Dr. Leslye Peer. [facility] 30 day card given as requested. Wife will transport.  Shelbie Ammons RN MSN CCM Care Management

## 2016-11-16 NOTE — Progress Notes (Signed)
Patient ID: Gabriel Grant, male   DOB: 05-02-1961, 56 y.o.   MRN: [id number] [place] [facility] [place] was admitted to the Hospital on 11/14/2016 and Discharged  11/16/2016 and should be excused from work/school   for 9 days starting 11/14/2016 , Please see Dr Humphrey Rolls prior to clearance to go back to work.  [name] M.D on 11/16/2016,at 8:16 AM  [place] [facility] [place]

## 2016-11-16 NOTE — Progress Notes (Signed)
Pt has orders to be discharged. Discharge instructions given and pt has no additional questions at this time. Medication regimen reviewed and pt educated. Pt verbalized understanding and has no additional questions. Telemetry box removed. IV removed and site in good condition. Pt stable and waiting for transportation.   Americo Vallery RN 

## 2016-11-16 NOTE — Progress Notes (Signed)
Patient ambulated around the nurse station after his PAD was fully deflated. Patient tolerated the ambulation well. Groin site still level 0. Will inform oncoming RN.

## 2017-01-20 DIAGNOSIS — I10 Essential (primary) hypertension: Secondary | ICD-10-CM | POA: Insufficient documentation

## 2017-01-20 DIAGNOSIS — I2510 Atherosclerotic heart disease of native coronary artery without angina pectoris: Secondary | ICD-10-CM | POA: Insufficient documentation

## 2017-01-20 DIAGNOSIS — Z955 Presence of coronary angioplasty implant and graft: Secondary | ICD-10-CM | POA: Insufficient documentation

## 2017-06-28 LAB — HIV ANTIBODY (ROUTINE TESTING W REFLEX): HIV Screen 4th Generation wRfx: NONREACTIVE

## 2018-07-31 ENCOUNTER — Encounter: Payer: Self-pay | Admitting: Emergency Medicine

## 2018-07-31 ENCOUNTER — Ambulatory Visit
Admission: EM | Admit: 2018-07-31 | Discharge: 2018-07-31 | Disposition: A | Discharge status: Home / Self Care | Payer: 59 | Attending: Family Medicine | Admitting: Family Medicine

## 2018-07-31 ENCOUNTER — Other Ambulatory Visit: Payer: Self-pay

## 2018-07-31 DIAGNOSIS — F17210 Nicotine dependence, cigarettes, uncomplicated: Secondary | ICD-10-CM

## 2018-07-31 DIAGNOSIS — R059 Cough, unspecified: Secondary | ICD-10-CM

## 2018-07-31 DIAGNOSIS — J0100 Acute maxillary sinusitis, unspecified: Secondary | ICD-10-CM | POA: Diagnosis not present

## 2018-07-31 DIAGNOSIS — R05 Cough: Secondary | ICD-10-CM | POA: Diagnosis not present

## 2018-07-31 HISTORY — DX: Atherosclerotic heart disease of native coronary artery without angina pectoris: I25.10

## 2018-07-31 MED ORDER — ALBUTEROL SULFATE HFA 108 (90 BASE) MCG/ACT IN AERS: 1.0000 | INHALATION_SPRAY | Freq: Four times a day (QID) | RESPIRATORY_TRACT | 0 refills | Status: DC | PRN

## 2018-07-31 MED ORDER — DOXYCYCLINE HYCLATE 100 MG PO TABS: 100.0000 mg | ORAL_TABLET | Freq: Two times a day (BID) | ORAL | 0 refills | Status: DC

## 2018-07-31 NOTE — ED Provider Notes (Signed)
[facility]    CSN: [id number] Arrival date & time: 07/31/18  0856     History   Chief Complaint Chief Complaint  Patient presents with  . URI    HPI Gabriel Grant is a 57 y.o. male.   The history is provided by the patient.  URI  Presenting symptoms: congestion, cough, facial pain and fatigue   Severity:  Moderate Onset quality:  Sudden Duration:  10 days Timing:  Constant Progression:  Worsening Chronicity:  New Relieved by:  Nothing Ineffective treatments:  OTC medications Associated symptoms: sinus pain and wheezing   Risk factors: chronic cardiac disease, chronic respiratory disease and sick contacts   Risk factors: not elderly, no chronic kidney disease, no diabetes mellitus, no immunosuppression, no recent illness and no recent travel     Past Medical History:  Diagnosis Date  . CAD (coronary artery disease)   . Hyperlipidemia   . Hypertension     Patient Active Problem List   Diagnosis Date Noted  . Acute MI ([place]) 11/14/2016  . NSTEMI (non-ST elevated myocardial infarction) ([place]) 11/14/2016    Past Surgical History:  Procedure Laterality Date  . CORONARY STENT INTERVENTION Right 11/15/2016   Procedure: Coronary Angiogram;  Surgeon: Dionisio David, MD;  Location: [place] [facility];  Service: Cardiovascular;  Laterality: Right;  . CORONARY STENT INTERVENTION N/A 11/15/2016   Procedure: Coronary Stent Intervention;  Surgeon: Yolonda Kida, MD;  Location: [place] [facility];  Service: Cardiovascular;  Laterality: N/A;  . LEFT HEART CATH AND CORONARY ANGIOGRAPHY Right 11/15/2016   Procedure: Left Heart Cath;  Surgeon: Dionisio David, MD;  Location: [facility];  Service: Cardiovascular;  Laterality: Right;  . TONSILLECTOMY         Home Medications    Prior to Admission medications   Medication Sig Start Date End Date Taking? Authorizing Provider  aspirin EC 81 MG EC tablet Take 1 tablet (81 mg total) by mouth daily. 11/16/16   Yes [name], MD  atorvastatin (LIPITOR) 40 MG tablet Take 1 tablet (40 mg total) by mouth daily at 6 PM. 11/16/16  Yes Wieting, Richard, MD  diphenhydramine-acetaminophen (TYLENOL PM) 25-500 MG TABS tablet Take 1 tablet by mouth at bedtime.   Yes [provider]  loratadine (CLARITIN) 10 MG tablet Take 10 mg by mouth daily.   Yes [provider]  metoprolol succinate (TOPROL-XL) 25 MG 24 hr tablet Take 1 tablet (25 mg total) by mouth daily. 11/16/16  Yes Wieting, Richard, MD  nitroGLYCERIN (NITROSTAT) 0.4 MG SL tablet Place 1 tablet (0.4 mg total) under the tongue every 5 (five) minutes as needed for chest pain. 11/16/16  Yes [name], MD  ticagrelor (BRILINTA) 90 MG TABS tablet Take 1 tablet (90 mg total) by mouth 2 (two) times daily. 11/16/16  Yes Wieting, Richard, MD  albuterol (PROVENTIL HFA;VENTOLIN HFA) 108 (90 Base) MCG/ACT inhaler Inhale 1-2 puffs into the lungs every 6 (six) hours as needed for wheezing or shortness of breath. 07/31/18   Gabriel Gable, MD  doxycycline (VIBRA-TABS) 100 MG tablet Take 1 tablet (100 mg total) by mouth 2 (two) times daily. 07/31/18   Gabriel Gable, MD  nicotine (NICODERM CQ - DOSED IN MG/24 HOURS) 21 mg/24hr patch Place 1 patch (21 mg total) onto the skin daily. 11/16/16   [name], MD    Family History Family History  Problem Relation Age of Onset  . Cancer Mother  breast  . Hyperlipidemia Mother   . Hypertension Father   . CAD Father   . CAD Paternal Grandfather     Social History Social History   Tobacco Use  . Smoking status: Current Every Day Smoker    Packs/day: 0.50    Types: Cigarettes  . Smokeless tobacco: Never Used  . Tobacco comment: trying to quit  Substance Use Topics  . Alcohol use: No  . Drug use: No     Allergies   Codeine   Review of Systems Review of Systems  Constitutional: Positive for fatigue.  HENT: Positive for congestion and sinus pain.   Respiratory: Positive for  cough and wheezing.      Physical Exam Triage Vital Signs ED Triage Vitals  Enc Vitals Group     BP 07/31/18 0910 (!) 185/99     Pulse Rate 07/31/18 0910 (!) 55     Resp 07/31/18 0910 16     Temp 07/31/18 0910 97.7 F (36.5 C)     Temp Source 07/31/18 0910 Oral     SpO2 07/31/18 0910 99 %     Weight 07/31/18 0909 164 lb (74.4 kg)     Height 07/31/18 0909 6' (1.829 m)     Head Circumference --      Peak Flow --      Pain Score 07/31/18 0909 2     Pain Loc --      Pain Edu? --      Excl. in [place]? --    No data found.  Updated Vital Signs BP (!) 188/99 (BP Location: Right Arm)   Pulse (!) 55   Temp 97.7 F (36.5 C) (Oral)   Resp 16   Ht 6' (1.829 m)   Wt 74.4 kg   SpO2 99%   BMI 22.24 kg/m   Visual Acuity Right Eye Distance:   Left Eye Distance:   Bilateral Distance:    Right Eye Near:   Left Eye Near:    Bilateral Near:     Physical Exam  Constitutional: He appears well-developed and well-nourished. No distress.  HENT:  Head: Normocephalic and atraumatic.  Right Ear: Tympanic membrane, external ear and ear canal normal.  Left Ear: Tympanic membrane, external ear and ear canal normal.  Nose: Mucosal edema and rhinorrhea present. Right sinus exhibits maxillary sinus tenderness and frontal sinus tenderness. Left sinus exhibits maxillary sinus tenderness and frontal sinus tenderness.  Mouth/Throat: Uvula is midline, oropharynx is clear and moist and mucous membranes are normal. No oropharyngeal exudate or tonsillar abscesses.  Neck: Normal range of motion. Neck supple. No tracheal deviation present. No thyromegaly present.  Cardiovascular: Normal rate, regular rhythm and normal heart sounds.  Pulmonary/Chest: Effort normal. No stridor. No respiratory distress. He has wheezes (few; plus diffuse rhonchi). He has no rales. He exhibits no tenderness.  Lymphadenopathy:    He has no cervical adenopathy.  Neurological: He is alert.  Skin: Skin is warm and dry. No rash  noted. He is not diaphoretic.  Nursing note and vitals reviewed.    UC Treatments / Results  Labs (all labs ordered are listed, but only abnormal results are displayed) Labs Reviewed - No data to display  EKG None  Radiology No results found.  Procedures Procedures (including critical care time)  Medications Ordered in UC Medications - No data to display  Initial Impression / Assessment and Plan / UC Course  I have reviewed the triage vital signs and the nursing notes.  Pertinent labs &  imaging results that were available during my care of the patient were reviewed by me and considered in my medical decision making (see chart for details).      Final Clinical Impressions(s) / UC Diagnoses   Final diagnoses:  Cough  Acute maxillary sinusitis, recurrence not specified    ED Prescriptions    Medication Sig Dispense Auth. Provider   doxycycline (VIBRA-TABS) 100 MG tablet Take 1 tablet (100 mg total) by mouth 2 (two) times daily. 20 tablet Gabriel Gable, MD   albuterol (PROVENTIL HFA;VENTOLIN HFA) 108 (90 Base) MCG/ACT inhaler Inhale 1-2 puffs into the lungs every 6 (six) hours as needed for wheezing or shortness of breath. 1 Inhaler Dezmon Conover, Linward Foster, MD     1. diagnosis reviewed with patient 2. rx as per orders above; reviewed possible side effects, interactions, risks and benefits   3. Follow-up prn if symptoms worsen or don't improve    Controlled Substance Prescriptions [place] Controlled Substance Registry consulted? Not Applicable   Gabriel Gable, MD 07/31/18 1023

## 2018-07-31 NOTE — ED Triage Notes (Signed)
Patient in today c/o head and chest cold x 1 week and now having body aches since yesterday. Patient has had chills, but hasn't taken temperature. Patient has tried OTC Dayquil/Nyquil without relief.

## 2018-08-30 ENCOUNTER — Encounter: Payer: Self-pay | Admitting: Emergency Medicine

## 2018-08-30 ENCOUNTER — Emergency Department: Payer: 59

## 2018-08-30 ENCOUNTER — Other Ambulatory Visit: Payer: Self-pay

## 2018-08-30 ENCOUNTER — Ambulatory Visit (INDEPENDENT_AMBULATORY_CARE_PROVIDER_SITE_OTHER)
Admission: EM | Admit: 2018-08-30 | Discharge: 2018-08-30 | Disposition: A | Discharge status: Home / Self Care | Payer: 59 | Source: Home / Self Care | Attending: Family Medicine | Admitting: Family Medicine

## 2018-08-30 ENCOUNTER — Emergency Department
Admission: EM | Admit: 2018-08-30 | Discharge: 2018-08-30 | Disposition: A | Discharge status: Home / Self Care | Payer: 59 | Attending: Emergency Medicine | Admitting: Emergency Medicine

## 2018-08-30 DIAGNOSIS — I10 Essential (primary) hypertension: Secondary | ICD-10-CM | POA: Insufficient documentation

## 2018-08-30 DIAGNOSIS — I252 Old myocardial infarction: Secondary | ICD-10-CM | POA: Diagnosis not present

## 2018-08-30 DIAGNOSIS — Z8679 Personal history of other diseases of the circulatory system: Secondary | ICD-10-CM

## 2018-08-30 DIAGNOSIS — R079 Chest pain, unspecified: Secondary | ICD-10-CM

## 2018-08-30 DIAGNOSIS — F17210 Nicotine dependence, cigarettes, uncomplicated: Secondary | ICD-10-CM | POA: Diagnosis not present

## 2018-08-30 DIAGNOSIS — R0789 Other chest pain: Secondary | ICD-10-CM | POA: Diagnosis present

## 2018-08-30 DIAGNOSIS — E7800 Pure hypercholesterolemia, unspecified: Secondary | ICD-10-CM

## 2018-08-30 DIAGNOSIS — Z955 Presence of coronary angioplasty implant and graft: Secondary | ICD-10-CM | POA: Diagnosis not present

## 2018-08-30 DIAGNOSIS — R5383 Other fatigue: Secondary | ICD-10-CM | POA: Diagnosis not present

## 2018-08-30 DIAGNOSIS — I2510 Atherosclerotic heart disease of native coronary artery without angina pectoris: Secondary | ICD-10-CM | POA: Diagnosis not present

## 2018-08-30 LAB — BASIC METABOLIC PANEL
Anion gap: 6 (ref 5–15)
BUN: 19 mg/dL (ref 6–20)
CALCIUM: 10 mg/dL (ref 8.9–10.3)
CO2: 25 mmol/L (ref 22–32)
CREATININE: 1.09 mg/dL (ref 0.61–1.24)
Chloride: 106 mmol/L (ref 98–111)
GFR calc Af Amer: >60 mL/min (ref >60)
GFR calc non Af Amer: >60 mL/min (ref >60)
GLUCOSE: 148 mg/dL — AB (ref 70–99)
Potassium: 4.1 mmol/L (ref 3.5–5.1)
SODIUM: 137 mmol/L (ref 135–145)

## 2018-08-30 LAB — CBC
HCT: 41.3 % (ref 39.0–52.0)
HEMOGLOBIN: 13.6 g/dL (ref 13.0–17.0)
MCH: 30.1 pg (ref 26.0–34.0)
MCHC: 32.9 g/dL (ref 30.0–36.0)
MCV: 91.4 fL (ref 80.0–100.0)
PLATELETS: 259 10*3/uL (ref 150–400)
RBC: 4.52 MIL/uL (ref 4.22–5.81)
RDW: 12.9 % (ref 11.5–15.5)
WBC: 7.7 10*3/uL (ref 4.0–10.5)
nRBC: 0 % (ref 0.0–0.2)

## 2018-08-30 LAB — TROPONIN I: Troponin I: <0.03 ng/mL (ref <0.03)

## 2018-08-30 MED ORDER — ASPIRIN 81 MG PO CHEW
324.0000 mg | CHEWABLE_TABLET | Freq: Once | ORAL | Status: AC
  Administered 2018-08-30: 324 mg via ORAL

## 2018-08-30 MED ORDER — NITROGLYCERIN 0.4 MG SL SUBL
0.4000 mg | SUBLINGUAL_TABLET | Freq: Once | SUBLINGUAL | Status: AC
  Administered 2018-08-30: 0.4 mg via SUBLINGUAL
  Filled 2018-08-30: qty 1

## 2018-08-30 NOTE — ED Triage Notes (Signed)
Patient c/o mid chest pain that started yesterday. He states he has been having intermittent pain that started yesterday and he describes the pain as a soreness.

## 2018-08-30 NOTE — ED Triage Notes (Signed)
Pt here with c/o mild cp describes as feeling "like a bruise," has been tired the past few weeks, had MI in 2017, is feeling the same as that time. Appears pale, NAD. Was seen at [facility] this am and was told to come here, pt denied EMS.

## 2018-08-30 NOTE — ED Provider Notes (Signed)
[facility]    CSN: [id number] Arrival date & time: 08/30/18  1696     History   Chief Complaint Chief Complaint  Patient presents with  . Chest Pain    HPI Gabriel Grant is a 57 y.o. male.   57 yo male with a h/o CAD (s/p PCI and stent to RCA in March 2018), hypertension, hyperlipidemia, current smoker presents with a c/o chest pains "on and off" since yesterday. Describes pain as "soreness" in the middle of the chest and stomach area. Pain has been intermittent since yesterday but not has had constantly since this morning for about the past hour. Patient states he's been very busy and stressful at work at the [employer].   The history is provided by the patient.  Chest Pain    Past Medical History:  Diagnosis Date  . CAD (coronary artery disease)   . Hyperlipidemia   . Hypertension     Patient Active Problem List   Diagnosis Date Noted  . Acute MI ([place]) 11/14/2016  . NSTEMI (non-ST elevated myocardial infarction) ([place]) 11/14/2016    Past Surgical History:  Procedure Laterality Date  . CORONARY STENT INTERVENTION Right 11/15/2016   Procedure: Coronary Angiogram;  Surgeon: Dionisio David, MD;  Location: [place] [facility];  Service: Cardiovascular;  Laterality: Right;  . CORONARY STENT INTERVENTION N/A 11/15/2016   Procedure: Coronary Stent Intervention;  Surgeon: Yolonda Kida, MD;  Location: [place] [facility];  Service: Cardiovascular;  Laterality: N/A;  . LEFT HEART CATH AND CORONARY ANGIOGRAPHY Right 11/15/2016   Procedure: Left Heart Cath;  Surgeon: Dionisio David, MD;  Location: [facility];  Service: Cardiovascular;  Laterality: Right;  . TONSILLECTOMY         Home Medications    Prior to Admission medications   Medication Sig Start Date End Date Taking? Authorizing Provider  albuterol (PROVENTIL HFA;VENTOLIN HFA) 108 (90 Base) MCG/ACT inhaler Inhale 1-2 puffs into the lungs every 6 (six) hours as needed for wheezing or  shortness of breath. 07/31/18  Yes [name], MD  aspirin EC 81 MG EC tablet Take 1 tablet (81 mg total) by mouth daily. 11/16/16  Yes [name], MD  atorvastatin (LIPITOR) 40 MG tablet Take 1 tablet (40 mg total) by mouth daily at 6 PM. 11/16/16  Yes Wieting, Richard, MD  diphenhydramine-acetaminophen (TYLENOL PM) 25-500 MG TABS tablet Take 1 tablet by mouth at bedtime.   Yes [provider]  doxycycline (VIBRA-TABS) 100 MG tablet Take 1 tablet (100 mg total) by mouth 2 (two) times daily. 07/31/18  Yes [name], MD  loratadine (CLARITIN) 10 MG tablet Take 10 mg by mouth daily.   Yes [provider]  metoprolol succinate (TOPROL-XL) 25 MG 24 hr tablet Take 1 tablet (25 mg total) by mouth daily. 11/16/16  Yes Wieting, Richard, MD  nicotine (NICODERM CQ - DOSED IN MG/24 HOURS) 21 mg/24hr patch Place 1 patch (21 mg total) onto the skin daily. 11/16/16  Yes Wieting, Richard, MD  nitroGLYCERIN (NITROSTAT) 0.4 MG SL tablet Place 1 tablet (0.4 mg total) under the tongue every 5 (five) minutes as needed for chest pain. 11/16/16  Yes [name], MD  ticagrelor (BRILINTA) 90 MG TABS tablet Take 1 tablet (90 mg total) by mouth 2 (two) times daily. 11/16/16  Yes [name], MD    Family History Family History  Problem Relation Age of Onset  . Cancer Mother  breast  . Hyperlipidemia Mother   . Hypertension Father   . CAD Father   . CAD Paternal Grandfather     Social History Social History   Tobacco Use  . Smoking status: Current Every Day Smoker    Packs/day: 0.50    Types: Cigarettes  . Smokeless tobacco: Never Used  . Tobacco comment: trying to quit  Substance Use Topics  . Alcohol use: No  . Drug use: No     Allergies   Codeine   Review of Systems Review of Systems  Cardiovascular: Positive for chest pain.     Physical Exam Triage Vital Signs ED Triage Vitals  Enc Vitals Group     BP 08/30/18 0822 (!) 164/92     Pulse Rate 08/30/18  0822 (!) 54     Resp 08/30/18 0822 18     Temp 08/30/18 0822 98 F (36.7 C)     Temp Source 08/30/18 0822 Oral     SpO2 08/30/18 0822 100 %     Weight 08/30/18 0833 160 lb (72.6 kg)     Height 08/30/18 0833 6' (1.829 m)     Head Circumference --      Peak Flow --      Pain Score 08/30/18 0833 5     Pain Loc --      Pain Edu? --      Excl. in [place]? --    No data found.  Updated Vital Signs BP (!) 164/92 (BP Location: Left Arm)   Pulse (!) 54   Temp 98 F (36.7 C) (Oral)   Resp 18   Ht 6' (1.829 m)   Wt 72.6 kg   SpO2 100%   BMI 21.70 kg/m   Visual Acuity Right Eye Distance:   Left Eye Distance:   Bilateral Distance:    Right Eye Near:   Left Eye Near:    Bilateral Near:     Physical Exam Vitals signs and nursing note reviewed.  Constitutional:      General: He is not in acute distress.    Appearance: Normal appearance. He is not toxic-appearing or diaphoretic.  Cardiovascular:     Rate and Rhythm: Regular rhythm. Bradycardia present.     Pulses: Normal pulses.     Heart sounds: No murmur. No friction rub. No gallop.   Pulmonary:     Effort: Pulmonary effort is normal. No respiratory distress.     Breath sounds: Normal breath sounds. No stridor. No wheezing, rhonchi or rales.  Musculoskeletal:     Right lower leg: No edema.     Left lower leg: No edema.  Neurological:     Mental Status: He is alert.      UC Treatments / Results  Labs (all labs ordered are listed, but only abnormal results are displayed) Labs Reviewed - No data to display  EKG None  Radiology No results found.  Procedures ED EKG Date/Time: 08/30/2018 8:50 AM Performed by: [name], MD Authorized by: [name], MD   ECG reviewed by ED Physician in the absence of a cardiologist: yes   Previous ECG:    Previous ECG:  Compared to current   Similarity:  No change Interpretation:    Interpretation: abnormal   Rate:    ECG rate:  51   ECG rate assessment: bradycardic    Rhythm:    Rhythm: sinus bradycardia   Ectopy:    Ectopy: none   QRS:    QRS axis:  Normal  Conduction:    Conduction: normal   ST segments:    ST segments:  Normal T waves:    T waves: normal     (including critical care time)  Medications Ordered in UC Medications  aspirin chewable tablet 324 mg (324 mg Oral Given 08/30/18 0836)    Initial Impression / Assessment and Plan / UC Course  I have reviewed the triage vital signs and the nursing notes.  Pertinent labs & imaging results that were available during my care of the patient were reviewed by me and considered in my medical decision making (see chart for details).      Final Clinical Impressions(s) / UC Diagnoses   Final diagnoses:  Chest pain, unspecified type  History of coronary artery disease  Essential hypertension  Pure hypercholesterolemia     Discharge Instructions     Explained to patient recommendation to go to hospital emergency department by ambulance    ED Prescriptions    None     1. ekg results and possible diagnosis reviewed with patient; patient given 4 baby ASA to chew; discussed with patient my recommendation that he go to the Emergency Department by EMS (ambulance), however patient refuses and patient states he will drive himself to the hospital. Explained to patient risks of this and reasons for going by EMS. Patient verbalizes understanding and still refuses transport by EMS.   Controlled Substance Prescriptions [place] Controlled Substance Registry consulted? Not Applicable   [name], MD 08/30/18 [phone]

## 2018-08-30 NOTE — Discharge Instructions (Signed)
Explained to patient recommendation to go to hospital emergency department by ambulance

## 2018-08-30 NOTE — ED Provider Notes (Signed)
[facility] Emergency Department Provider Note       Time seen: ----------------------------------------- 10:48 AM on 08/30/2018 -----------------------------------------   I have reviewed the triage vital signs and the nursing notes.  HISTORY   Chief Complaint Chest Pain    HPI ESCHOL AUXIER is a 57 y.o. male with a history of coronary artery disease, hyperlipidemia, hypertension who presents to the ED for chest pain that he describes like a bruising sensation like someone's punched him.  He also reports recent fatigue but reports he is working a lot.  He had an MI in 2017 and this does not feel as severe.  He has not had sweats, nausea or shortness of breath like he did with his heart attack.  Past Medical History:  Diagnosis Date  . CAD (coronary artery disease)   . Hyperlipidemia   . Hypertension     Patient Active Problem List   Diagnosis Date Noted  . Acute MI ([place]) 11/14/2016  . NSTEMI (non-ST elevated myocardial infarction) ([place]) 11/14/2016    Past Surgical History:  Procedure Laterality Date  . CORONARY STENT INTERVENTION Right 11/15/2016   Procedure: Coronary Angiogram;  Surgeon: Dionisio David, MD;  Location: [place] [facility];  Service: Cardiovascular;  Laterality: Right;  . CORONARY STENT INTERVENTION N/A 11/15/2016   Procedure: Coronary Stent Intervention;  Surgeon: Yolonda Kida, MD;  Location: [place] [facility];  Service: Cardiovascular;  Laterality: N/A;  . LEFT HEART CATH AND CORONARY ANGIOGRAPHY Right 11/15/2016   Procedure: Left Heart Cath;  Surgeon: Dionisio David, MD;  Location: [place] [facility];  Service: Cardiovascular;  Laterality: Right;  . TONSILLECTOMY      Allergies Codeine  Social History Social History   Tobacco Use  . Smoking status: Current Every Day Smoker    Packs/day: 0.50    Types: Cigarettes  . Smokeless tobacco: Never Used  . Tobacco comment: trying to quit  Substance Use Topics  .  Alcohol use: No  . Drug use: No   Review of Systems Constitutional: Negative for fever. Cardiovascular: Positive for chest pain Respiratory: Negative for shortness of breath. Gastrointestinal: Negative for abdominal pain, vomiting and diarrhea. Musculoskeletal: Negative for back pain. Skin: Negative for rash. Neurological: Negative for headaches, focal weakness or numbness.  All systems negative/normal/unremarkable except as stated in the HPI  ____________________________________________   PHYSICAL EXAM:  VITAL SIGNS: ED Triage Vitals  Enc Vitals Group     BP 08/30/18 0940 (!) 171/94     Pulse Rate 08/30/18 0940 (!) 52     Resp --      Temp 08/30/18 0940 97.7 F (36.5 C)     Temp Source 08/30/18 0940 Oral     SpO2 08/30/18 0940 100 %     Weight 08/30/18 0940 165 lb (74.8 kg)     Height 08/30/18 0940 6' (1.829 m)     Head Circumference --      Peak Flow --      Pain Score 08/30/18 0945 4     Pain Loc --      Pain Edu? --      Excl. in [place]? --    Constitutional: Alert and oriented. Well appearing and in no distress. Eyes: Conjunctivae are normal. Normal extraocular movements. ENT   Head: Normocephalic and atraumatic.   Nose: No congestion/rhinnorhea.   Mouth/Throat: Mucous membranes are moist.   Neck: No stridor. Cardiovascular: Normal rate, regular rhythm. No murmurs, rubs, or gallops. Respiratory: Normal respiratory  effort without tachypnea nor retractions. Breath sounds are clear and equal bilaterally. No wheezes/rales/rhonchi. Gastrointestinal: Soft and nontender. Normal bowel sounds Musculoskeletal: Nontender with normal range of motion in extremities. No lower extremity tenderness nor edema. Neurologic:  Normal speech and language. No gross focal neurologic deficits are appreciated.  Skin:  Skin is warm, dry and intact. No rash noted. Psychiatric: Mood and affect are normal. Speech and behavior are normal.   ____________________________________________  EKG: Interpreted by me.  Sinus bradycardia with a rate of 51 bpm, normal PR interval, normal QRS, normal QT  ____________________________________________  ED COURSE:  As part of my medical decision making, I reviewed the following data within the [place] History obtained from family if available, nursing notes, old chart and ekg, as well as notes from prior ED visits. Patient presented for nonspecific chest pain, we will assess with labs and imaging as indicated at this time.   Procedures ____________________________________________   LABS (pertinent positives/negatives)  Labs Reviewed  BASIC METABOLIC PANEL - Abnormal; Notable for the following components:      Result Value   Glucose, Bld 148 (*)    All other components within normal limits  CBC  TROPONIN I  TROPONIN I    RADIOLOGY  Chest x-ray Is unremarkable ____________________________________________  DIFFERENTIAL DIAGNOSIS   Musculoskeletal pain, GERD, MI, PE, fatigue  FINAL ASSESSMENT AND PLAN  Nonspecific chest pain   Plan: The patient had presented for nonspecific chest pain. Patient's labs were reassuring. Patient's imaging is also reassuring.  I discussed with his cardiologist who is who will see him at 9 AM in the morning.   Laurence Aly, MD   Note: This note was generated in part or whole with voice recognition software. Voice recognition is usually quite accurate but there are transcription errors that can and very often do occur. I apologize for any typographical errors that were not detected and corrected.     Earleen Newport, MD 08/30/18 1318

## 2018-08-30 NOTE — ED Notes (Signed)
Patient transported to X-ray 

## 2018-09-13 HISTORY — PX: CHOLECYSTECTOMY: SHX55

## 2019-01-23 DIAGNOSIS — K8020 Calculus of gallbladder without cholecystitis without obstruction: Secondary | ICD-10-CM | POA: Insufficient documentation

## 2020-08-04 LAB — COLOGUARD: COLOGUARD: NEGATIVE

## 2020-11-04 ENCOUNTER — Emergency Department: Payer: Federal, State, Local not specified - PPO

## 2020-11-04 ENCOUNTER — Ambulatory Visit
Admission: EM | Admit: 2020-11-04 | Discharge: 2020-11-04 | Disposition: A | Discharge status: Home / Self Care | Payer: Federal, State, Local not specified - PPO | Attending: Emergency Medicine | Admitting: Emergency Medicine

## 2020-11-04 ENCOUNTER — Encounter: Payer: Self-pay | Admitting: Emergency Medicine

## 2020-11-04 ENCOUNTER — Other Ambulatory Visit: Payer: Self-pay

## 2020-11-04 ENCOUNTER — Emergency Department
Admission: EM | Admit: 2020-11-04 | Discharge: 2020-11-04 | Disposition: A | Discharge status: Home / Self Care | Payer: Federal, State, Local not specified - PPO | Attending: Emergency Medicine | Admitting: Emergency Medicine

## 2020-11-04 DIAGNOSIS — K2900 Acute gastritis without bleeding: Secondary | ICD-10-CM | POA: Insufficient documentation

## 2020-11-04 DIAGNOSIS — K429 Umbilical hernia without obstruction or gangrene: Secondary | ICD-10-CM | POA: Diagnosis not present

## 2020-11-04 DIAGNOSIS — R1013 Epigastric pain: Secondary | ICD-10-CM | POA: Diagnosis not present

## 2020-11-04 DIAGNOSIS — Z79899 Other long term (current) drug therapy: Secondary | ICD-10-CM | POA: Insufficient documentation

## 2020-11-04 DIAGNOSIS — F17210 Nicotine dependence, cigarettes, uncomplicated: Secondary | ICD-10-CM | POA: Insufficient documentation

## 2020-11-04 DIAGNOSIS — R140 Abdominal distension (gaseous): Secondary | ICD-10-CM | POA: Diagnosis not present

## 2020-11-04 DIAGNOSIS — I10 Essential (primary) hypertension: Secondary | ICD-10-CM | POA: Insufficient documentation

## 2020-11-04 DIAGNOSIS — K5730 Diverticulosis of large intestine without perforation or abscess without bleeding: Secondary | ICD-10-CM | POA: Diagnosis not present

## 2020-11-04 DIAGNOSIS — R079 Chest pain, unspecified: Secondary | ICD-10-CM

## 2020-11-04 DIAGNOSIS — K6389 Other specified diseases of intestine: Secondary | ICD-10-CM | POA: Diagnosis not present

## 2020-11-04 DIAGNOSIS — I2510 Atherosclerotic heart disease of native coronary artery without angina pectoris: Secondary | ICD-10-CM | POA: Insufficient documentation

## 2020-11-04 DIAGNOSIS — R001 Bradycardia, unspecified: Secondary | ICD-10-CM | POA: Diagnosis not present

## 2020-11-04 DIAGNOSIS — R0789 Other chest pain: Secondary | ICD-10-CM | POA: Diagnosis not present

## 2020-11-04 DIAGNOSIS — Z7982 Long term (current) use of aspirin: Secondary | ICD-10-CM | POA: Diagnosis not present

## 2020-11-04 LAB — BASIC METABOLIC PANEL
Anion gap: 7 (ref 5–15)
BUN: 15 mg/dL (ref 6–20)
CO2: 26 mmol/L (ref 22–32)
Calcium: 10.1 mg/dL (ref 8.9–10.3)
Chloride: 103 mmol/L (ref 98–111)
Creatinine, Ser: 1.14 mg/dL (ref 0.61–1.24)
GFR, Estimated: >60 mL/min (ref >60)
Glucose, Bld: 100 mg/dL — ABNORMAL HIGH (ref 70–99)
Potassium: 4 mmol/L (ref 3.5–5.1)
Sodium: 136 mmol/L (ref 135–145)

## 2020-11-04 LAB — HEPATIC FUNCTION PANEL
ALT: 18 U/L (ref 0–44)
AST: 18 U/L (ref 15–41)
Albumin: 4.2 g/dL (ref 3.5–5.0)
Alkaline Phosphatase: 70 U/L (ref 38–126)
Bilirubin, Direct: 0.1 mg/dL (ref 0.0–0.2)
Indirect Bilirubin: 0.8 mg/dL (ref 0.3–0.9)
Total Bilirubin: 0.9 mg/dL (ref 0.3–1.2)
Total Protein: 7.4 g/dL (ref 6.5–8.1)

## 2020-11-04 LAB — CBC
HCT: 41.9 % (ref 39.0–52.0)
Hemoglobin: 14.2 g/dL (ref 13.0–17.0)
MCH: 29.8 pg (ref 26.0–34.0)
MCHC: 33.9 g/dL (ref 30.0–36.0)
MCV: 87.8 fL (ref 80.0–100.0)
Platelets: 267 10*3/uL (ref 150–400)
RBC: 4.77 MIL/uL (ref 4.22–5.81)
RDW: 12.8 % (ref 11.5–15.5)
WBC: 10.7 10*3/uL — ABNORMAL HIGH (ref 4.0–10.5)
nRBC: 0 % (ref 0.0–0.2)

## 2020-11-04 LAB — TROPONIN I (HIGH SENSITIVITY)
Troponin I (High Sensitivity): 5 ng/L (ref <18)
Troponin I (High Sensitivity): 5 ng/L (ref <18)

## 2020-11-04 LAB — LIPASE, BLOOD: Lipase: 38 U/L (ref 11–51)

## 2020-11-04 IMAGING — CT CT ABD-PELV W/ CM
2 of 5 series · 15 of 46 positions shown, 17 images · IV contrast (APPLIED)
Comparison: Noncontrast abdominopelvic CT [DATE]. Chest
radiographs today and [DATE]

CLINICAL DATA: Abdominal distension for 2 days. Previous
cholecystectomy.

EXAM:
CT ABDOMEN AND PELVIS WITH CONTRAST
TECHNIQUE: Multidetector CT imaging of the abdomen and pelvis was performed
using the standard protocol following bolus administration of
intravenous contrast.
CONTRAST:  100mL OMNIPAQUE IOHEXOL 300 MG/ML  SOLN

[Series 2: routine abd/pel with · axial · 0.72mm/px · z∈[-522,-122]mm · 12 of 90 slices shown, 14 images]
[im 5/90  soft-tissue]
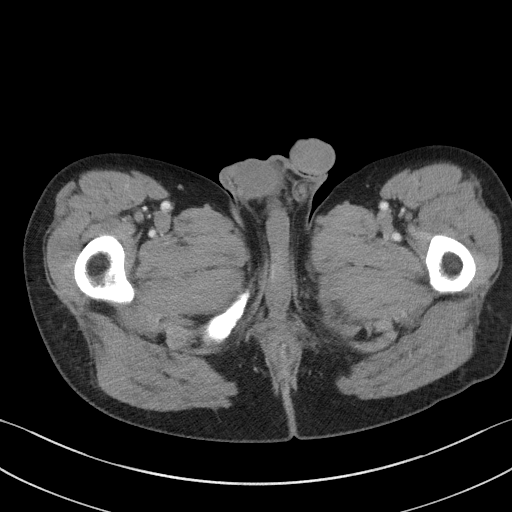
[im 5/90  bone]
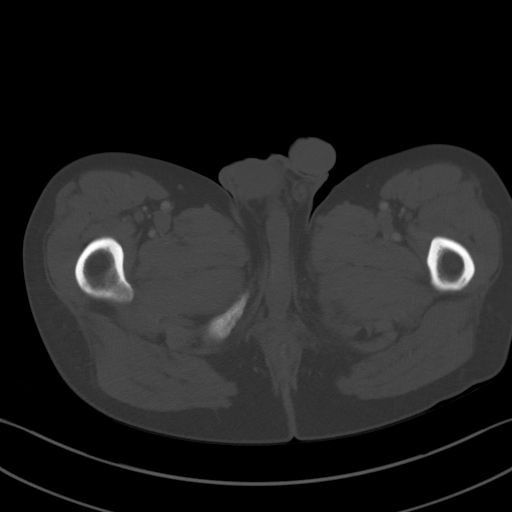
[im 15/90  soft-tissue]
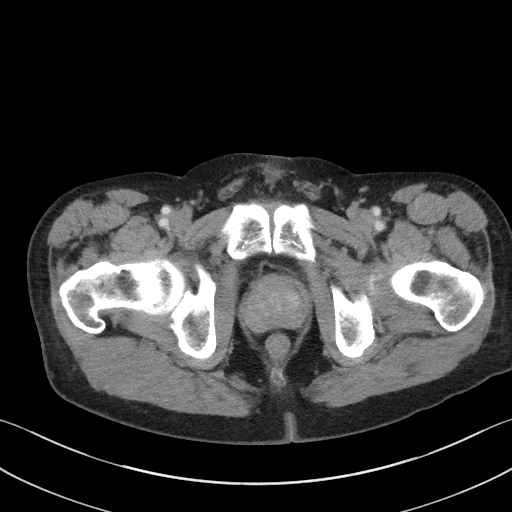
[im 20/90  soft-tissue]
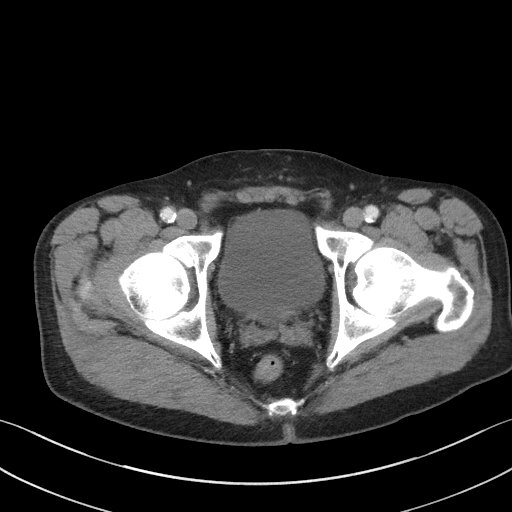
[im 25/90  soft-tissue]
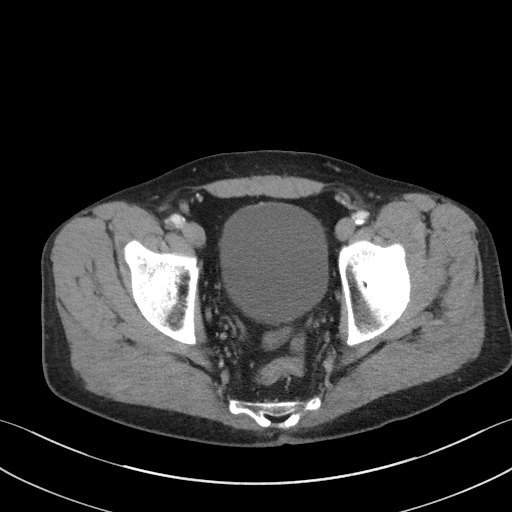
[im 35/90  soft-tissue]
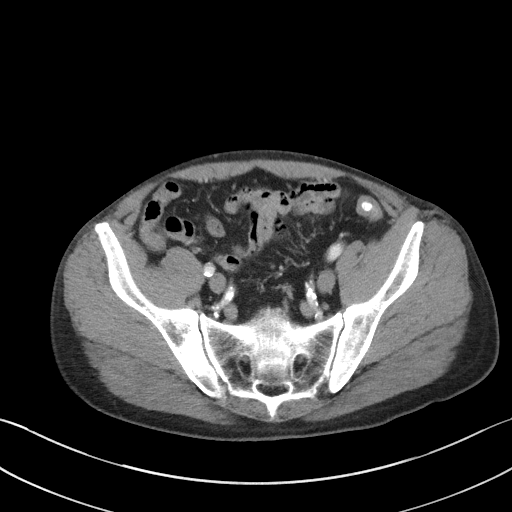
[im 40/90  soft-tissue]
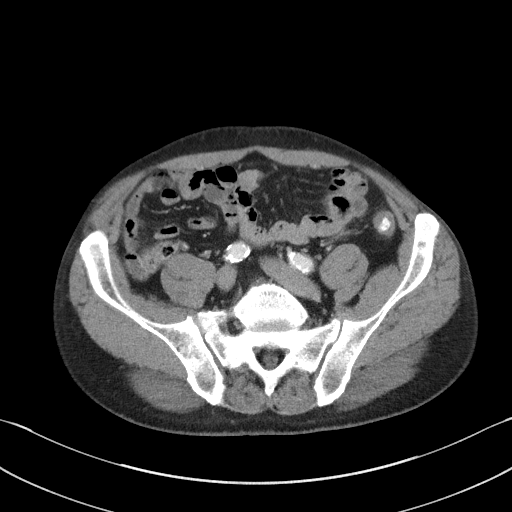
[im 50/90  soft-tissue]
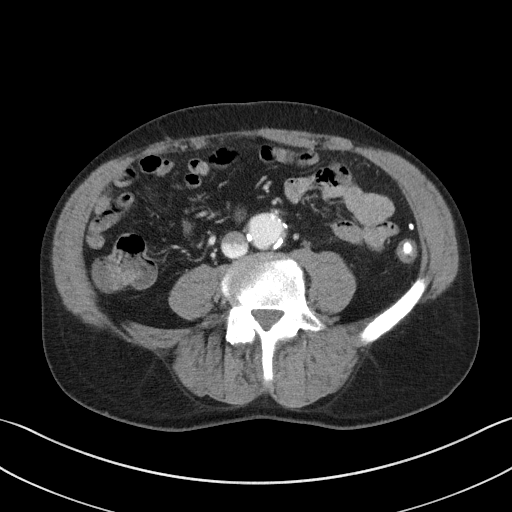
[im 55/90  soft-tissue]
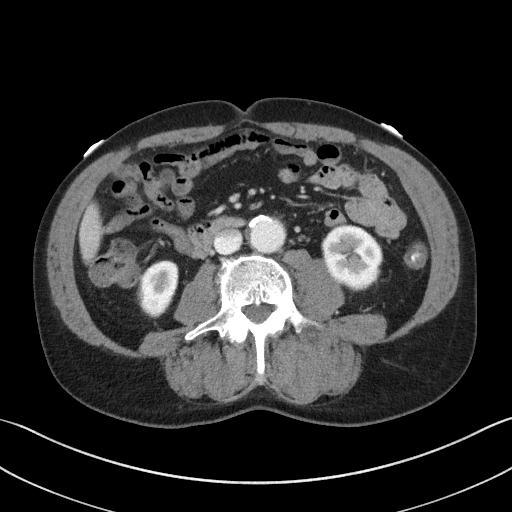
[im 65/90  soft-tissue]
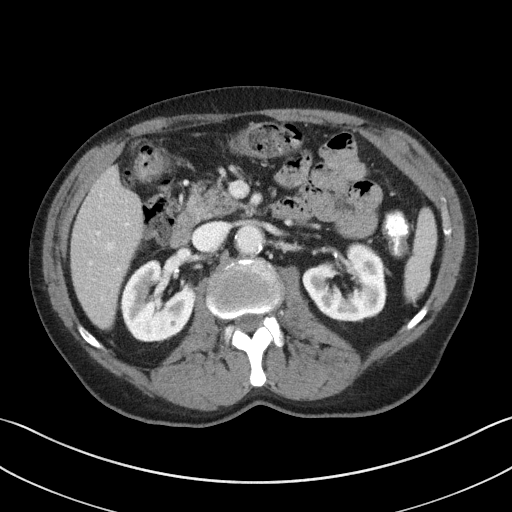
[im 65/90  bone]
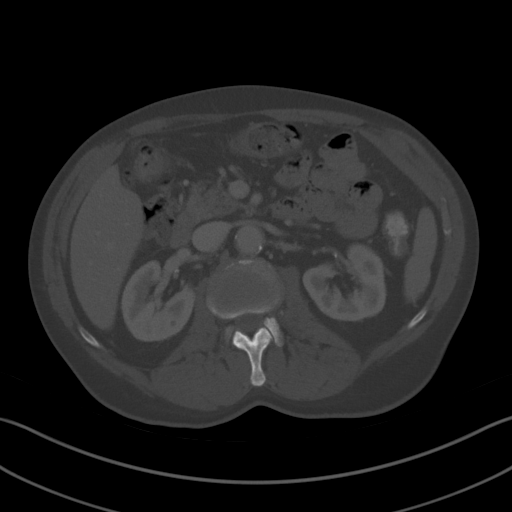
[im 70/90  soft-tissue]
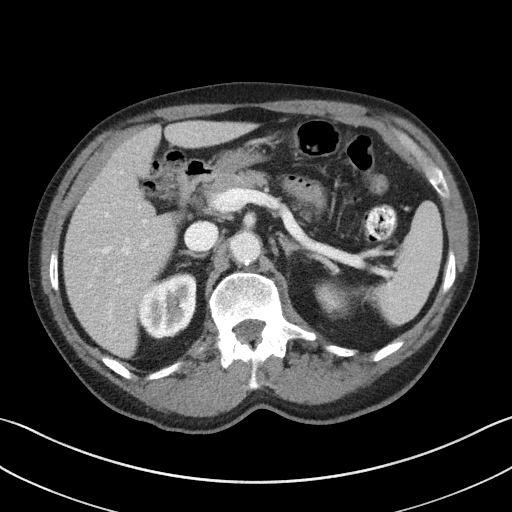
[im 75/90  soft-tissue]
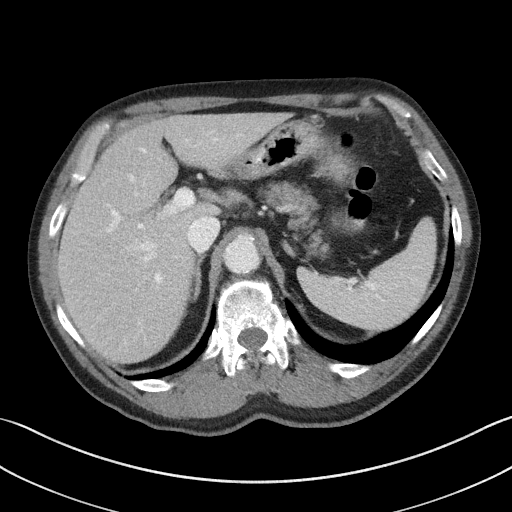
[im 85/90  soft-tissue]
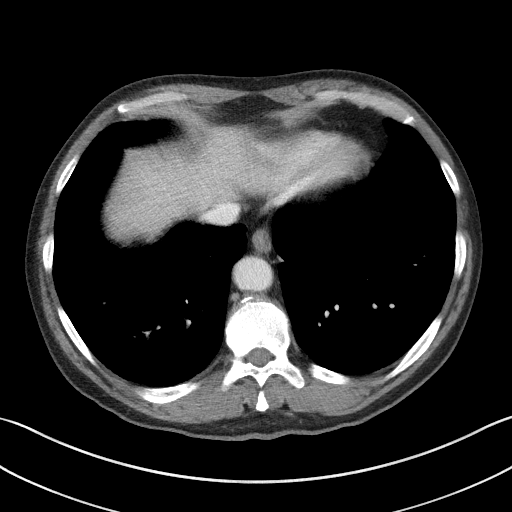

[Series 6: coronal st · coronal · 0.71mm/px · 3 of 85 slices shown]
[im 29/85  soft-tissue]
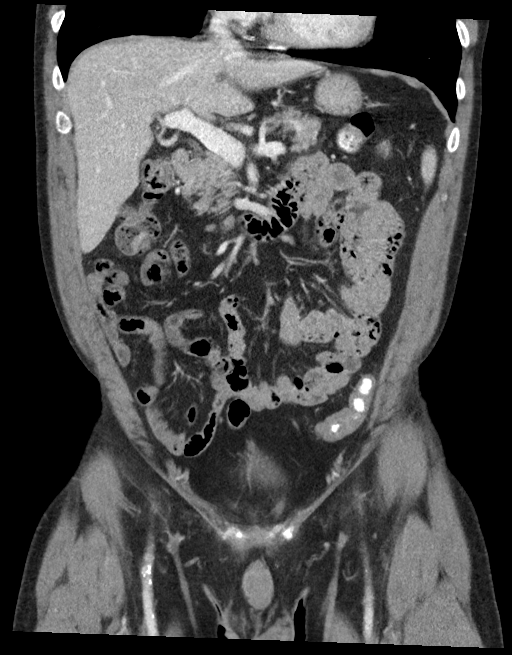
[im 38/85  soft-tissue]
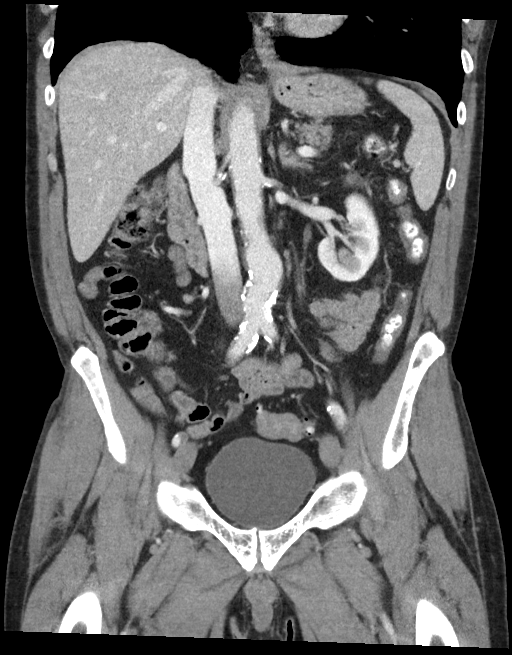
[im 47/85  soft-tissue]
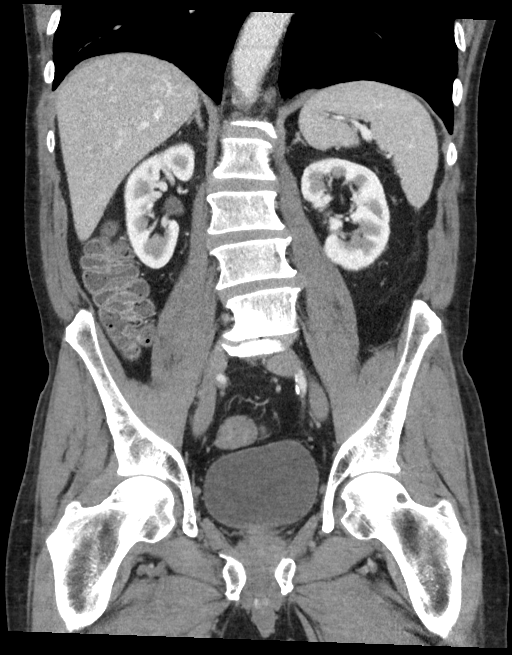

[15 of 46 positions shown; findings below may reference images not displayed]

FINDINGS: Lower chest: Coarse interstitial opacities at both lung bases,
similar to previous chest radiographs and probably reflecting
chronic interstitial lung disease superimposed on emphysema. No
airspace disease, pleural or pericardial effusion.

Hepatobiliary: The liver is normal in density without suspicious
focal abnormality. No evidence of gallstones, gallbladder wall
thickening or biliary dilatation.

Pancreas: Unremarkable. No pancreatic ductal dilatation or
surrounding inflammatory changes.

Spleen: Normal in size without focal abnormality.

Adrenals/Urinary Tract: Both adrenal glands appear normal. There are
tiny low-density renal lesions bilaterally which are too small to
optimally characterize, but likely represent cysts. No evidence of
enhancing renal mass, urinary tract calculus or hydronephrosis. The
bladder appears normal.

Stomach/Bowel: A small amount of enteric contrast is present in the
distal colon. The stomach is decompressed without focal abnormality.
The small bowel, appendix and proximal colon appear normal. There
are diverticular changes throughout the descending and sigmoid colon
and rectum with mild wall thickening, but no surrounding
inflammation.

Vascular/Lymphatic: There are no enlarged abdominal or pelvic lymph
nodes. Diffuse aortic and branch vessel atherosclerosis. There is
mild dilatation of the infrarenal abdominal aorta to 2.9 cm, less
than 50% greater diameter than the more proximal aorta. No follow-up
recommended. No evidence of large vessel occlusion. The portal,
superior mesenteric and splenic veins are patent. There is a
circumaortic left renal vein.

Reproductive: Mild central enlargement and heterogeneity of the
prostate gland.

Other: Small umbilical hernia containing only fat. No ascites or
free air. No evidence of abdominal wall mass.

Musculoskeletal: No acute or significant osseous findings.
Progressive lower lumbar spondylosis, with asymmetric endplate
degeneration on the right at L5-S1. Transitional S1 level.
IMPRESSION: 1. No explanation for abdominal distention identified.  No ascites.
2. Distal colonic diverticulosis with mild wall thickening, but no
surrounding inflammatory changes.
3. Mild dilatation of the distal aorta without focal aneurysm.
Aortic Atherosclerosis ([QP]-[QP]) and Emphysema ([QP]-[QP]).

## 2020-11-04 MED ORDER — ASPIRIN 81 MG PO CHEW
324.0000 mg | CHEWABLE_TABLET | Freq: Once | ORAL | Status: AC
  Administered 2020-11-04: 324 mg via ORAL

## 2020-11-04 MED ORDER — ONDANSETRON 4 MG PO TBDP
4.0000 mg | ORAL_TABLET | Freq: Three times a day (TID) | ORAL | 0 refills | Status: DC | PRN
Start: 2020-11-04 — End: 2021-01-16

## 2020-11-04 MED ORDER — SUCRALFATE 1 G PO TABS: 1.0000 g | ORAL_TABLET | Freq: Three times a day (TID) | ORAL | 0 refills | Status: DC

## 2020-11-04 MED ORDER — IOHEXOL 300 MG/ML  SOLN
100.0000 mL | Freq: Once | INTRAMUSCULAR | Status: AC | PRN
  Administered 2020-11-04: 100 mL via INTRAVENOUS

## 2020-11-04 NOTE — ED Notes (Signed)
Patient is being discharged from the Urgent Care and sent to the Emergency Department via EMS. Per Randol Kern, MD patient is in need of higher level of care due to abnormal EKG. Patient is aware and verbalizes understanding of plan of care.  Vitals:   11/04/20 0921  BP: 125/70  Pulse: (!) 59  Resp: 16  SpO2: 99%

## 2020-11-04 NOTE — ED Triage Notes (Signed)
First Nurse: per [facility], pt sent from [place] w/central cp for further eval. Pain started Sunday, currently 4/10. Given 324 ASA en route, no NTG. Hx MI 6 yrs ago w/stents.

## 2020-11-04 NOTE — ED Notes (Signed)
EMS was called and report given.

## 2020-11-04 NOTE — Discharge Instructions (Addendum)
This could be secondary to gastritis or ulcer.  Take the Carafate to help line the stomach and use Tylenol 1 g every 8 hours to help with pain.  Stop taking any form of NSAIDs such as ibuprofen, Motrin, Advil.  Reduce your smoking.  Continue taking your acid reducer Protonix and use the Zofran to help with nausea.  Call the number above to follow-up with a GI doctor.  Return to the ER if develop fevers, worsening pain or any other concerns  IMPRESSION:  1. No explanation for abdominal distention identified.  No ascites.  2. Distal colonic diverticulosis with mild wall thickening, but no  surrounding inflammatory changes.  3. Mild dilatation of the distal aorta without focal aneurysm.  Aortic Atherosclerosis (ICD10-I70.0) and Emphysema (ICD10-J43.9).

## 2020-11-04 NOTE — ED Notes (Signed)
Pt taken to CT.

## 2020-11-04 NOTE — ED Provider Notes (Signed)
[facility] Emergency Department Provider Note  ____________________________________________   Event Date/Time   First MD Initiated Contact with Patient 11/04/20 1418     (approximate)  I have reviewed the triage vital signs and the nursing notes.   HISTORY  Chief Complaint Chest pain   HPI [name] is a 60 y.o. male with hypertension, hyperlipidemia, coronary disease with prior NSTEMI requiring stenting who comes in with chest pain.  Patient was seen at 10 AM this morning.  Patient has a history of indigestion which she is on Protonix for.  Patient was already given 324 of aspirin.  Patient states that the pain is more in his upper abdomen, intermittent for the past few days, nothing makes it better, nothing makes it worse.  States that he just has not felt hungry recently but denies vomiting.  No diarrhea.  Last bowel movement 48 hours ago.  Has already had his gallbladder removed.  Patient reports having a stent 4 years ago.  He is on aspirin only.  He does take a lot of NSAIDs daily.  Denies any alcohol use but does smoke.  No shortness of breath            Past Medical History:  Diagnosis Date  . CAD (coronary artery disease)   . Hyperlipidemia   . Hypertension     Patient Active Problem List   Diagnosis Date Noted  . Acute MI ([place]) 11/14/2016  . NSTEMI (non-ST elevated myocardial infarction) ([place]) 11/14/2016    Past Surgical History:  Procedure Laterality Date  . CORONARY STENT INTERVENTION Right 11/15/2016   Procedure: Coronary Angiogram;  Surgeon: Dionisio David, MD;  Location: [place] [facility];  Service: Cardiovascular;  Laterality: Right;  . CORONARY STENT INTERVENTION N/A 11/15/2016   Procedure: Coronary Stent Intervention;  Surgeon: Yolonda Kida, MD;  Location: [place] [facility];  Service: Cardiovascular;  Laterality: N/A;  . LEFT HEART CATH AND CORONARY ANGIOGRAPHY Right 11/15/2016   Procedure: Left Heart Cath;   Surgeon: Dionisio David, MD;  Location: [place] [facility];  Service: Cardiovascular;  Laterality: Right;  . TONSILLECTOMY      Prior to Admission medications   Medication Sig Start Date End Date Taking? Authorizing Provider  albuterol (PROVENTIL HFA;VENTOLIN HFA) 108 (90 Base) MCG/ACT inhaler Inhale 1-2 puffs into the lungs every 6 (six) hours as needed for wheezing or shortness of breath. 07/31/18   [name], MD  aspirin EC 81 MG EC tablet Take 1 tablet (81 mg total) by mouth daily. 11/16/16   [name], MD  atorvastatin (LIPITOR) 40 MG tablet Take 1 tablet (40 mg total) by mouth daily at 6 PM. 11/16/16   Leslye Peer, Richard, MD  diphenhydramine-acetaminophen (TYLENOL PM) 25-500 MG TABS tablet Take 1 tablet by mouth at bedtime.    [provider]  doxycycline (VIBRA-TABS) 100 MG tablet Take 1 tablet (100 mg total) by mouth 2 (two) times daily. 07/31/18   [name], MD  loratadine (CLARITIN) 10 MG tablet Take 10 mg by mouth daily.    [provider]  metoprolol succinate (TOPROL-XL) 25 MG 24 hr tablet Take 1 tablet (25 mg total) by mouth daily. 11/16/16   [name], MD  nicotine (NICODERM CQ - DOSED IN MG/24 HOURS) 21 mg/24hr patch Place 1 patch (21 mg total) onto the skin daily. 11/16/16   [name], MD  nitroGLYCERIN (NITROSTAT) 0.4 MG SL tablet Place 1 tablet (0.4 mg total) under the tongue every  5 (five) minutes as needed for chest pain. 11/16/16   [name], MD  ticagrelor (BRILINTA) 90 MG TABS tablet Take 1 tablet (90 mg total) by mouth 2 (two) times daily. 11/16/16   [name], MD    Allergies Codeine  Family History  Problem Relation Age of Onset  . Cancer Mother        breast  . Hyperlipidemia Mother   . Hypertension Father   . CAD Father   . CAD Paternal Grandfather     Social History Social History   Tobacco Use  . Smoking status: Current Every Day Smoker    Packs/day: 0.50    Types: Cigarettes  . Smokeless tobacco:  Never Used  . Tobacco comment: trying to quit  Vaping Use  . Vaping Use: Never used  Substance Use Topics  . Alcohol use: No  . Drug use: No      Review of Systems Constitutional: No fever/chills Eyes: No visual changes. ENT: No sore throat. Cardiovascular: Positive chest pain Respiratory: Denies shortness of breath. Gastrointestinal: Positive abdominal pain no nausea, no vomiting.  No diarrhea.  No constipation. Genitourinary: Negative for dysuria. Musculoskeletal: Negative for back pain. Skin: Negative for rash. Neurological: Negative for headaches, focal weakness or numbness. All other ROS negative ____________________________________________   PHYSICAL EXAM:  VITAL SIGNS: ED Triage Vitals  Enc Vitals Group     BP 11/04/20 1102 134/82     Pulse Rate 11/04/20 1102 (!) 51     Resp 11/04/20 1102 17     Temp 11/04/20 1102 98.1 F (36.7 C)     Temp Source 11/04/20 1102 Oral     SpO2 11/04/20 1102 100 %     Weight 11/04/20 1100 160 lb 15 oz (73 kg)     Height 11/04/20 1103 6' (1.829 m)     Head Circumference --      Peak Flow --      Pain Score --      Pain Loc --      Pain Edu? --      Excl. in [place]? --     Constitutional: Alert and oriented. Well appearing and in no acute distress. Eyes: Conjunctivae are normal. EOMI. Head: Atraumatic. Nose: No congestion/rhinnorhea. Mouth/Throat: Mucous membranes are moist.   Neck: No stridor. Trachea Midline. FROM Cardiovascular: Normal rate, regular rhythm. Grossly normal heart sounds.  Good peripheral circulation. Respiratory: Normal respiratory effort.  No retractions. Lungs CTAB. Gastrointestinal: Tenderness in the upper abdomen no distention. No abdominal bruits.  Musculoskeletal: No lower extremity tenderness nor edema.  No joint effusions. Neurologic:  Normal speech and language. No gross focal neurologic deficits are appreciated.  Skin:  Skin is warm, dry and intact. No rash noted. Psychiatric: Mood and affect are  normal. Speech and behavior are normal. GU: Deferred   ____________________________________________   LABS (all labs ordered are listed, but only abnormal results are displayed)  Labs Reviewed  BASIC METABOLIC PANEL - Abnormal; Notable for the following components:      Result Value   Glucose, Bld 100 (*)    All other components within normal limits  CBC - Abnormal; Notable for the following components:   WBC 10.7 (*)    All other components within normal limits  TROPONIN I (HIGH SENSITIVITY)  TROPONIN I (HIGH SENSITIVITY)   ____________________________________________   ED ECG REPORT I, Vanessa Burdette, the attending physician, personally viewed and interpreted this ECG.  Sinus bradycardia rate of 51, no ST elevation, no T  wave inversions, normal intervals ____________________________________________  RADIOLOGY Robert Bellow, personally viewed and evaluated these images (plain radiographs) as part of my medical decision making, as well as reviewing the written report by the radiologist.  ED MD interpretation: No pneumonia  Official radiology report(s): DG Chest 2 View  Result Date: 11/04/2020 CLINICAL DATA:  Chest pain history of hypertension and myocardial infarction EXAM: CHEST - 2 VIEW COMPARISON:  Chest radiograph August 22, 2018 FINDINGS: The heart size and mediastinal contours are within normal limits. Similar diffuse coarse interstitial opacities. No focal consolidation. No pleural effusion. No pneumothorax. No acute osseous abnormality. IMPRESSION: No active cardiopulmonary disease. Electronically Signed   By: Dahlia Bailiff MD   On: 11/04/2020 11:31    ____________________________________________   PROCEDURES  Procedure(s) performed (including Critical Care):  .1-3 Lead EKG Interpretation Performed by: Vanessa [place], MD Authorized by: Vanessa [place], MD     Interpretation: abnormal     ECG rate:  50s    ECG rate assessment: bradycardic     Rhythm: sinus  bradycardia     Ectopy: none     Conduction: normal       ____________________________________________   INITIAL IMPRESSION / ASSESSMENT AND PLAN / ED COURSE   [place] was evaluated in Emergency Department on 11/04/2020 for the symptoms described in the history of present illness. He was evaluated in the context of the global COVID-19 pandemic, which necessitated consideration that the patient might be at risk for infection with the SARS-CoV-2 virus that causes COVID-19. Institutional protocols and algorithms that pertain to the evaluation of patients at risk for COVID-19 are in a state of rapid change based on information released by regulatory bodies including the CDC and federal and state organizations. These policies and algorithms were followed during the patient's care in the ED.    Most Likely DDx:  -Consider ACS given history of stent however his pain seems to be more in his abdomen and given his smoking and NSAID use I suspect that this could be gastritis versus ulcer.  Will get CT imaging to make sure no acute pathology such as perforation, SBO.  Patient declines pain medication at this time   DDx that was also considered d/t potential to cause harm, but was found less likely based on history and physical (as detailed above): -PNA (no fevers, cough but CXR to evaluate) -PNX (reassured with equal b/l breath sounds, CXR to evaluate) -Symptomatic anemia (will get H&H) -Pulmonary embolism as no sob at rest, not pleuritic in nature, no hypoxia -Aortic Dissection as no tearing pain and no radiation to the mid back, pulses equal -Pericarditis no rub on exam, EKG changes or hx to suggest dx -Tamponade (no notable SOB, tachycardic, hypotensive) -Esophageal rupture (no h/o diffuse vomitting/no crepitus)  Cardiac markers negative x2  LFTs are negative  Incidental findings discussed with patient.  I suspect this is most likely gastritis given his NSAID use and smoking history we  discussed stopping this as well is taking Tylenol for pain.  Will start on Carafate.  Patient already on Protonix.  Will give Zofran and follow-up with GI     ____________________________________________   FINAL CLINICAL IMPRESSION(S) / ED DIAGNOSES   Final diagnoses:  Acute gastritis without hemorrhage, unspecified gastritis type     MEDICATIONS GIVEN DURING THIS VISIT:  Medications  iohexol (OMNIPAQUE) 300 MG/ML solution 100 mL (100 mLs Intravenous Contrast Given 11/04/20 1454)     ED Discharge Orders  Ordered    sucralfate (CARAFATE) 1 g tablet  3 times daily with meals & bedtime        11/04/20 1535    ondansetron (ZOFRAN ODT) 4 MG disintegrating tablet  Every 8 hours PRN        11/04/20 1535           Note:  This document was prepared using Dragon voice recognition software and may include unintentional dictation errors.   Vanessa [place], MD 11/04/20 [phone]

## 2020-11-04 NOTE — ED Provider Notes (Signed)
HPI  SUBJECTIVE:  Gabriel Grant is a 60 y.o. male who presents with 2 days of left-sided chest and epigastric pain accompanied with cough, anorexia, fatigue, malaise.  He describes the pain as if "someone is hitting me over and over."  States that he has had a lot of "indigestion" over the past 2 days with belching, waterbrash despite taking his Protonix.  No nausea, diaphoresis, radiation up his neck, down his arm.  States it goes through to his back.  No trauma to the chest or abdomen.  No change in his physical activity.  No exertional component.  He tried Advil 800 mg twice daily without improvement in symptoms.  Symptoms are worse with lying down.  He states his pain feels similar to his previous pain when he had an NSTEMI.  No recent viral illnesses, fevers.  No syncope.  He did not take an aspirin today.  Past medical history of NSTEMI, continues to smoke, coronary artery disease status post stenting.  He is not on any antiplatelets.  Also History of hypercholesterolemia, GERD.  No history of diabetes, hypertension, aortic abdominal aneurysm, peptic ulcer disease, perforated ulcer.  Family history significant for father with MI at age 69.  QIW:[id number], Elta Guadeloupe, MD    Past Medical History:  Diagnosis Date  . CAD (coronary artery disease)   . Hyperlipidemia   . Hypertension     Past Surgical History:  Procedure Laterality Date  . CORONARY STENT INTERVENTION Right 11/15/2016   Procedure: Coronary Angiogram;  Surgeon: Dionisio David, MD;  Location: [place] [facility];  Service: Cardiovascular;  Laterality: Right;  . CORONARY STENT INTERVENTION N/A 11/15/2016   Procedure: Coronary Stent Intervention;  Surgeon: Yolonda Kida, MD;  Location: [place] [facility];  Service: Cardiovascular;  Laterality: N/A;  . LEFT HEART CATH AND CORONARY ANGIOGRAPHY Right 11/15/2016   Procedure: Left Heart Cath;  Surgeon: Dionisio David, MD;  Location: [place] [facility];  Service: Cardiovascular;   Laterality: Right;  . TONSILLECTOMY      Family History  Problem Relation Age of Onset  . Cancer Mother        breast  . Hyperlipidemia Mother   . Hypertension Father   . CAD Father   . CAD Paternal Grandfather     Social History   Tobacco Use  . Smoking status: Current Every Day Smoker    Packs/day: 0.50    Types: Cigarettes  . Smokeless tobacco: Never Used  . Tobacco comment: trying to quit  Vaping Use  . Vaping Use: Never used  Substance Use Topics  . Alcohol use: No  . Drug use: No    No current facility-administered medications for this encounter.  Current Outpatient Medications:  .  albuterol (PROVENTIL HFA;VENTOLIN HFA) 108 (90 Base) MCG/ACT inhaler, Inhale 1-2 puffs into the lungs every 6 (six) hours as needed for wheezing or shortness of breath., Disp: 1 Inhaler, Rfl: 0 .  aspirin EC 81 MG EC tablet, Take 1 tablet (81 mg total) by mouth daily., Disp: 30 tablet, Rfl: 0 .  atorvastatin (LIPITOR) 40 MG tablet, Take 1 tablet (40 mg total) by mouth daily at 6 PM., Disp: 30 tablet, Rfl: 0 .  diphenhydramine-acetaminophen (TYLENOL PM) 25-500 MG TABS tablet, Take 1 tablet by mouth at bedtime., Disp: , Rfl:  .  doxycycline (VIBRA-TABS) 100 MG tablet, Take 1 tablet (100 mg total) by mouth 2 (two) times daily., Disp: 20 tablet, Rfl: 0 .  loratadine (CLARITIN) 10 MG tablet,  Take 10 mg by mouth daily., Disp: , Rfl:  .  metoprolol succinate (TOPROL-XL) 25 MG 24 hr tablet, Take 1 tablet (25 mg total) by mouth daily., Disp: 30 tablet, Rfl: 0 .  nicotine (NICODERM CQ - DOSED IN MG/24 HOURS) 21 mg/24hr patch, Place 1 patch (21 mg total) onto the skin daily., Disp: 28 patch, Rfl: 0 .  nitroGLYCERIN (NITROSTAT) 0.4 MG SL tablet, Place 1 tablet (0.4 mg total) under the tongue every 5 (five) minutes as needed for chest pain., Disp: 30 tablet, Rfl: 0 .  ticagrelor (BRILINTA) 90 MG TABS tablet, Take 1 tablet (90 mg total) by mouth 2 (two) times daily., Disp: 60 tablet, Rfl: 0  Allergies   Allergen Reactions  . Codeine Other (See Comments)    Severe headache.     ROS  As noted in HPI.   Physical Exam  BP 125/70 (BP Location: Right Arm)   Pulse (!) 59   Resp 16   Wt 73 kg   SpO2 99%   BMI 21.84 kg/m   Constitutional: Well developed, well nourished, no acute distress Eyes:  EOMI, conjunctiva normal bilaterally HENT: Normocephalic, atraumatic,mucus membranes moist Respiratory: Normal inspiratory effort, lungs clear bilaterally. Cardiovascular: Normal rate regular rhythm, no murmurs rubs or gallops.  No reproducible chest wall tenderness GI: Soft.  Positive exquisite epigastric tenderness.  No guarding, rebound.  Active bowel sounds.  No distention.  No appreciable pulsatile abdominal mass. skin: No rash, skin intact Musculoskeletal: no deformities Neurologic: Alert & oriented x 3, no focal neuro deficits Psychiatric: Speech and behavior appropriate   ED Course   Medications  aspirin chewable tablet 324 mg (324 mg Oral Given 11/04/20 0958)    Orders Placed This Encounter  Procedures  . ED EKG    Standing Status:   Standing    Number of Occurrences:   1    Order Specific Question:   Reason for Exam    Answer:   Chest Pain    No results found for this or any previous visit (from the past 24 hour(s)). No results found.  ED Clinical Impression  1. Chest pain, unspecified type   2. Abdominal pain, epigastric      ED Assessment/Plan  EKG: Sinus bradycardia, rate 53.  Rightward axis.  No hypertrophy.  Normal intervals. STE in V2-V6.  Patient was symptomatic while EKG was obtained.  Patient does have some epigastric tenderness, however, he states that his chest pain is similar to when he had his previous NSTEMI which is concerning.   he has multiple cardiac risk factors, significant family history.  In the differential is ACS/NSTEMI, perforated peptic ulcer, aneurysm.  His EKG has ST elevation in V2 through V6, difficult to tell if this is early  repolarization or true ST elevation.  Feel he would benefit from serial troponins and EKGs.  Transferring to the emergency department via EMS for comprehensive evaluation.  Giving 324 mg of aspirin here.  Discussed  MDM, rationale for transfer to the emergency department via EMS.  He agrees with plan.  Meds ordered this encounter  Medications  . aspirin chewable tablet 324 mg    *This clinic note was created using [employer]. Therefore, there may be occasional mistakes despite careful proofreading.    Melynda Ripple, MD 11/04/20 1009

## 2020-11-04 NOTE — ED Triage Notes (Signed)
Patient presents to Urgent Care with complaints of chest pain and mid abdominal pain since Sunday. Pt states he has a hx of a heart attack and reports with that experience he had similar symptoms. Treating pain with ibuprofen. Pt has a nitro prescription states he has not taken any of the chest pain.   Denies any SOB, n/v, fever, or diarrhea.

## 2020-12-23 ENCOUNTER — Ambulatory Visit (INDEPENDENT_AMBULATORY_CARE_PROVIDER_SITE_OTHER): Payer: Federal, State, Local not specified - PPO | Admitting: Gastroenterology

## 2020-12-23 ENCOUNTER — Encounter: Payer: Self-pay | Admitting: Gastroenterology

## 2020-12-23 ENCOUNTER — Other Ambulatory Visit: Payer: Self-pay

## 2020-12-23 VITALS — BP 118/74 | HR 57 | Temp 98.3°F | Ht 72.0 in | Wt 164.4 lb

## 2020-12-23 DIAGNOSIS — K529 Noninfective gastroenteritis and colitis, unspecified: Secondary | ICD-10-CM | POA: Diagnosis not present

## 2020-12-23 DIAGNOSIS — R1013 Epigastric pain: Secondary | ICD-10-CM | POA: Diagnosis not present

## 2020-12-23 DIAGNOSIS — G8929 Other chronic pain: Secondary | ICD-10-CM

## 2020-12-23 DIAGNOSIS — Z01818 Encounter for other preprocedural examination: Secondary | ICD-10-CM

## 2020-12-23 DIAGNOSIS — I252 Old myocardial infarction: Secondary | ICD-10-CM

## 2020-12-23 DIAGNOSIS — R140 Abdominal distension (gaseous): Secondary | ICD-10-CM | POA: Diagnosis not present

## 2020-12-23 NOTE — Patient Instructions (Addendum)
1. Will scheduled EGD for Epigastric pain after you see Cardiology.  2. Zenpep samples given. Take 2 capsules with the first bite of each meal and 1 capsule with the first bite of each snack.

## 2020-12-23 NOTE — Progress Notes (Signed)
Cephas Darby, MD [address]  [place]  [place], [place] [address]  Main: [phone]  Fax: [phone]    Gastroenterology Consultation  Referring Provider:     Marygrace Drought, MD Primary Care Physician:  Marygrace Drought, MD Primary Gastroenterologist:  Dr. Cephas Darby Reason for Consultation:     Chronic epigastric pain, postprandial diarrhea        HPI:   Gabriel Grant is a 60 y.o. male referred by Dr. Marygrace Drought, MD  for consultation & management of chronic epigastric pain, postprandial diarrhea.  Patient reports approximately 3 months history of constant pain in the epigastric region associated with postprandial urgency and diarrhea about 3-4 times daily.  He denies any rectal bleeding.  He does report some abdominal bloating.  He reports worsening of symptoms with fried foods, fatty foods.  He does have history of 40+ years of smoking tobacco.  He does have history of coronary disease, s/p PCI, previously on Brilinta which has been discontinued per his cardiology recommendations, currently on aspirin 81 mg daily.  He had history of cholecystectomy in 01/2019 at [facility] for epigastric pain, symptomatic cholelithiasis.  He reports that the pain was not relieved after cholecystectomy.  He does report gradual weight loss as well.  He was recently in the ER secondary to chest pain as well as epigastric pain, he thought he had another heart attack, troponins were negative, he underwent CT abdomen and pelvis which revealed normal pancreas, does have colonic diverticulosis.  Most recent labs revealed mild leukocytosis, hemoglobin 14.2, normal platelets, normal LFTs, normal lipase  He denies alcohol use  NSAIDs: None  Antiplts/Anticoagulants/Anti thrombotics: Aspirin 81 mg  GI Procedures: None Cologuard negative in 2021 He denies family history of pancreatic cancer, GI malignancy  Past Medical History:  Diagnosis Date  . CAD (coronary artery disease)   .  Hyperlipidemia   . Hypertension     Past Surgical History:  Procedure Laterality Date  . CORONARY STENT INTERVENTION Right 11/15/2016   Procedure: Coronary Angiogram;  Surgeon: Dionisio David, MD;  Location: [place] [facility];  Service: Cardiovascular;  Laterality: Right;  . CORONARY STENT INTERVENTION N/A 11/15/2016   Procedure: Coronary Stent Intervention;  Surgeon: Yolonda Kida, MD;  Location: [place] [facility];  Service: Cardiovascular;  Laterality: N/A;  . LEFT HEART CATH AND CORONARY ANGIOGRAPHY Right 11/15/2016   Procedure: Left Heart Cath;  Surgeon: Dionisio David, MD;  Location: [place] [facility];  Service: Cardiovascular;  Laterality: Right;  . TONSILLECTOMY      Current Outpatient Medications:  .  albuterol (PROVENTIL HFA;VENTOLIN HFA) 108 (90 Base) MCG/ACT inhaler, Inhale 1-2 puffs into the lungs every 6 (six) hours as needed for wheezing or shortness of breath., Disp: 1 Inhaler, Rfl: 0 .  aspirin EC 81 MG EC tablet, Take 1 tablet (81 mg total) by mouth daily., Disp: 30 tablet, Rfl: 0 .  atorvastatin (LIPITOR) 40 MG tablet, Take 1 tablet (40 mg total) by mouth daily at 6 PM., Disp: 30 tablet, Rfl: 0 .  diphenhydramine-acetaminophen (TYLENOL PM) 25-500 MG TABS tablet, Take 1 tablet by mouth at bedtime., Disp: , Rfl:  .  isosorbide dinitrate (ISORDIL) 30 MG tablet, Has never used this medication /pt report., Disp: , Rfl:  .  loratadine (CLARITIN) 10 MG tablet, Take 10 mg by mouth daily., Disp: , Rfl:  .  metoprolol succinate (TOPROL-XL) 25 MG 24 hr tablet, Take 1 tablet (25 mg total) by mouth daily.,  Disp: 30 tablet, Rfl: 0 .  nicotine (NICODERM CQ - DOSED IN MG/24 HOURS) 21 mg/24hr patch, Place 1 patch (21 mg total) onto the skin daily., Disp: 28 patch, Rfl: 0 .  nicotine polacrilex (COMMIT) 2 MG lozenge, Place inside cheek., Disp: , Rfl:  .  nitroGLYCERIN (NITROSTAT) 0.4 MG SL tablet, Place 1 tablet (0.4 mg total) under the tongue every 5 (five) minutes as needed for  chest pain., Disp: 30 tablet, Rfl: 0 .  ondansetron (ZOFRAN ODT) 4 MG disintegrating tablet, Take 1 tablet (4 mg total) by mouth every 8 (eight) hours as needed for nausea or vomiting., Disp: 20 tablet, Rfl: 0 .  pantoprazole (PROTONIX) 40 MG tablet, TAKE 1 TABLET(40 MG) BY MOUTH DAILY, Disp: , Rfl:  .  sildenafil (REVATIO) 20 MG tablet, Take 20 mg by mouth every morning., Disp: , Rfl:  .  sucralfate (CARAFATE) 1 g tablet, Take 1 tablet (1 g total) by mouth 4 (four) times daily -  with meals and at bedtime for 14 days., Disp: 56 tablet, Rfl: 0   Family History  Problem Relation Age of Onset  . Cancer Mother        breast  . Hyperlipidemia Mother   . Hypertension Father   . CAD Father   . CAD Paternal Grandfather      Social History   Tobacco Use  . Smoking status: Current Every Day Smoker    Packs/day: 0.50    Types: Cigarettes  . Smokeless tobacco: Never Used  . Tobacco comment: trying to quit  Vaping Use  . Vaping Use: Never used  Substance Use Topics  . Alcohol use: No  . Drug use: No    Allergies as of 12/23/2020 - Review Complete 12/23/2020  Allergen Reaction Noted  . Codeine Other (See Comments) 02/28/2016  . Sulfa antibiotics Rash 04/15/2020    Review of Systems:    All systems reviewed and negative except where noted in HPI.   Physical Exam:  BP 118/74 (BP Location: Left Arm, Patient Position: Sitting, Cuff Size: Normal)   Pulse (!) 57   Temp 98.3 F (36.8 C) (Oral)   Ht 6' (1.829 m)   Wt 164 lb 6 oz (74.6 kg)   BMI 22.29 kg/m  No LMP for male patient.  General:   Alert, thin built, moderately nourished, pleasant and cooperative in NAD Head:  Normocephalic and atraumatic. Eyes:  Sclera clear, no icterus.   Conjunctiva pink. Ears:  Normal auditory acuity. Nose:  No deformity, discharge, or lesions. Mouth:  No deformity or lesions,oropharynx pink & moist. Neck:  Supple; no masses or thyromegaly. Lungs:  Respirations even and unlabored.  Clear  throughout to auscultation.   No wheezes, crackles, or rhonchi. No acute distress. Heart:  Regular rate and rhythm; no murmurs, clicks, rubs, or gallops. Abdomen:  Normal bowel sounds. Soft, epigastric tenderness and mildly distended, tympanic to percussion without masses, hepatosplenomegaly or hernias noted.  No guarding or rebound tenderness.   Rectal: Not performed Msk:  Symmetrical without gross deformities. Good, equal movement & strength bilaterally. Pulses:  Normal pulses noted. Extremities:  No clubbing or edema.  No cyanosis. Neurologic:  Alert and oriented x3;  grossly normal neurologically. Skin:  Intact without significant lesions or rashes. No jaundice. Psych:  Alert and cooperative. Normal mood and affect.  Imaging Studies: Reviewed  Assessment and Plan:   Gabriel Grant is a 60 y.o. male with history of cholecystectomy at [facility] on 01/29/2019, coronary artery disease s/p PCI  and stent placement, previously on Brilinta, currently maintained on aspirin 81 mg daily, 40+ years of smoking tobacco is seen in consultation for chronic epigastric pain associated with postprandial urgency and abdominal bloating, gradual weight loss.  His symptoms are highly suggestive of chronic pancreatitis leading to exocrine pancreatic insufficiency.  However, recommend to rule out peptic ulcer disease, H. pylori infection  Recommend pancreatic fecal elastase levels Check H. pylori stool antigen Recommend upper endoscopy to rule out peptic ulcer disease, will need preop clearance by cardiology given his history of coronary artery disease/MI.  Will refer him to [facility] cardiology Empiric trial of pancreatic enzymes in the interim, samples provided Discussed about smoking cessation   Follow up in 2 months   Cephas Darby, MD

## 2021-01-05 DIAGNOSIS — M19011 Primary osteoarthritis, right shoulder: Secondary | ICD-10-CM | POA: Diagnosis not present

## 2021-01-06 ENCOUNTER — Telehealth: Payer: Self-pay | Admitting: Gastroenterology

## 2021-01-06 DIAGNOSIS — K529 Noninfective gastroenteritis and colitis, unspecified: Secondary | ICD-10-CM | POA: Diagnosis not present

## 2021-01-06 DIAGNOSIS — R1013 Epigastric pain: Secondary | ICD-10-CM | POA: Diagnosis not present

## 2021-01-06 DIAGNOSIS — R140 Abdominal distension (gaseous): Secondary | ICD-10-CM | POA: Diagnosis not present

## 2021-01-06 DIAGNOSIS — G8929 Other chronic pain: Secondary | ICD-10-CM | POA: Diagnosis not present

## 2021-01-06 MED ORDER — ZENPEP 40000-126000 UNITS PO CPEP: ORAL_CAPSULE | ORAL | 1 refills | Status: DC

## 2021-01-06 NOTE — Telephone Encounter (Signed)
zenpep 40K 2 capsules with first bite of each meal and 1 with snack.  If it is not covered, we can do Creon Dx: Exocrine pancreatic insufficiency  RV

## 2021-01-06 NOTE — Addendum Note (Signed)
Addended by: Ulyess Blossom L on: 01/06/2021 04:03 PM   Modules accepted: Orders

## 2021-01-06 NOTE — Telephone Encounter (Signed)
Sent medication to the pharmacy  

## 2021-01-06 NOTE — Telephone Encounter (Signed)
Patient called asking for RX for Zenpep to be sent over to [facility] in [place].

## 2021-01-06 NOTE — Telephone Encounter (Signed)
Please advised if this can be done  

## 2021-01-09 LAB — PANCREATIC ELASTASE, FECAL: Pancreatic Elastase, Fecal: 152 ug Elast./g — ABNORMAL LOW (ref >200)

## 2021-01-09 LAB — H. PYLORI ANTIGEN, STOOL: H pylori Ag, Stl: NEGATIVE

## 2021-01-12 ENCOUNTER — Other Ambulatory Visit: Payer: Self-pay

## 2021-01-12 ENCOUNTER — Encounter: Payer: Self-pay | Admitting: Gastroenterology

## 2021-01-15 MED ORDER — PANCRELIPASE (LIP-PROT-AMYL) 36000-114000 UNITS PO CPEP: ORAL_CAPSULE | ORAL | 1 refills | Status: DC

## 2021-01-16 ENCOUNTER — Encounter: Payer: Self-pay | Admitting: Cardiology

## 2021-01-16 ENCOUNTER — Ambulatory Visit: Payer: Federal, State, Local not specified - PPO | Admitting: Cardiology

## 2021-01-16 ENCOUNTER — Other Ambulatory Visit: Payer: Self-pay

## 2021-01-16 VITALS — BP 116/74 | HR 50 | Ht 72.0 in | Wt 162.4 lb

## 2021-01-16 DIAGNOSIS — I2510 Atherosclerotic heart disease of native coronary artery without angina pectoris: Secondary | ICD-10-CM | POA: Diagnosis not present

## 2021-01-16 DIAGNOSIS — R1010 Upper abdominal pain, unspecified: Secondary | ICD-10-CM

## 2021-01-16 DIAGNOSIS — I10 Essential (primary) hypertension: Secondary | ICD-10-CM | POA: Diagnosis not present

## 2021-01-16 DIAGNOSIS — Z01818 Encounter for other preprocedural examination: Secondary | ICD-10-CM | POA: Diagnosis not present

## 2021-01-16 MED ORDER — ATORVASTATIN CALCIUM 40 MG PO TABS: 40.0000 mg | ORAL_TABLET | Freq: Every day | ORAL | 5 refills | Status: DC

## 2021-01-16 MED ORDER — METOPROLOL SUCCINATE ER 25 MG PO TB24: 12.5000 mg | ORAL_TABLET | Freq: Every day | ORAL | 0 refills | Status: DC

## 2021-01-16 NOTE — Progress Notes (Signed)
Cardiology Office Note:    Date:  01/16/2021   ID:  Gabriel Grant, DOB 24-May-1961, MRN [id number]  PCP:  Marygrace Drought, MD   [facility] Providers Cardiologist:  Kate Sable, MD     Referring MD: Lin Landsman, MD   No chief complaint on file.  Gabriel Grant is a 60 y.o. male who is being seen today for the evaluation of epigastric pain, preop evaluation, at the request of Marius Ditch, Tally Due, MD.  History of Present Illness:    Gabriel Grant is a 60 y.o. male with a hx of hypertension, CAD/PCI to dRCA 2018, current smoker x40+ years who presents due to history of CAD and preop evaluation.  Patient being seen by gastroenterology due to pancreatic insufficiency, chronic epigastric pain, possible peptic ulcer disease.  EGD is being planned.  Patient had symptoms of chest discomfort, left heart cath showing significant stenosis in RCA, PCI to RCA in 2018.  Echocardiogram 11/2016 showed normal systolic function, EF [id number].  Does not take Lipitor, unsure why.  Denies shortness of breath or left-sided chest pain.  Past Medical History:  Diagnosis Date  . CAD (coronary artery disease)   . Hyperlipidemia   . Hypertension     Past Surgical History:  Procedure Laterality Date  . CORONARY STENT INTERVENTION Right 11/15/2016   Procedure: Coronary Angiogram;  Surgeon: Dionisio David, MD;  Location: [place] [facility];  Service: Cardiovascular;  Laterality: Right;  . CORONARY STENT INTERVENTION N/A 11/15/2016   Procedure: Coronary Stent Intervention;  Surgeon: Yolonda Kida, MD;  Location: [place] [facility];  Service: Cardiovascular;  Laterality: N/A;  . LEFT HEART CATH AND CORONARY ANGIOGRAPHY Right 11/15/2016   Procedure: Left Heart Cath;  Surgeon: Dionisio David, MD;  Location: [place] [facility];  Service: Cardiovascular;  Laterality: Right;  . TONSILLECTOMY      Current Medications: Current Meds  Medication Sig  . aspirin EC 81 MG EC tablet Take 1 tablet (81 mg  total) by mouth daily.  Marland Kitchen atorvastatin (LIPITOR) 40 MG tablet Take 1 tablet (40 mg total) by mouth daily.  . diphenhydramine-acetaminophen (TYLENOL PM) 25-500 MG TABS tablet Take 1 tablet by mouth at bedtime.  . lipase/protease/amylase (CREON) 36000 UNITS CPEP capsule Take 2 capsule with the first bite of each meal and 1 capsule with the first bite of each snack  . loratadine (CLARITIN) 10 MG tablet Take 10 mg by mouth daily.  . nicotine polacrilex (COMMIT) 2 MG lozenge Place inside cheek.  . nitroGLYCERIN (NITROSTAT) 0.4 MG SL tablet Place 1 tablet (0.4 mg total) under the tongue every 5 (five) minutes as needed for chest pain.  . pantoprazole (PROTONIX) 40 MG tablet TAKE 1 TABLET(40 MG) BY MOUTH DAILY  . sildenafil (REVATIO) 20 MG tablet Take 20 mg by mouth every morning.  . [DISCONTINUED] metoprolol succinate (TOPROL-XL) 25 MG 24 hr tablet Take 1 tablet (25 mg total) by mouth daily.     Allergies:   Codeine and Sulfa antibiotics   Social History   Socioeconomic History  . Marital status: Married    Spouse name: Not on file  . Number of children: Not on file  . Years of education: Not on file  . Highest education level: Not on file  Occupational History  . Not on file  Tobacco Use  . Smoking status: Current Every Day Smoker    Packs/day: 0.50    Types: Cigarettes  . Smokeless tobacco: Never Used  .  Tobacco comment: trying to quit  Vaping Use  . Vaping Use: Never used  Substance and Sexual Activity  . Alcohol use: No  . Drug use: No  . Sexual activity: Not on file  Other Topics Concern  . Not on file  Social History Narrative  . Not on file   Social Determinants of Health   Financial Resource Strain: Not on file  Food Insecurity: Not on file  Transportation Needs: Not on file  Physical Activity: Not on file  Stress: Not on file  Social Connections: Not on file     Family History: The patient's family history includes CAD in his father and paternal grandfather;  Cancer in his mother; Hyperlipidemia in his mother; Hypertension in his father.  ROS:   Please see the history of present illness.     All other systems reviewed and are negative.  EKGs/Labs/Other Studies Reviewed:    The following studies were reviewed today:   EKG:  EKG is  ordered today.  The ekg ordered today demonstrates sinus bradycardia, heart rate 50  Recent Labs: 11/04/2020: ALT 18; BUN 15; Creatinine, Ser 1.14; Hemoglobin 14.2; Platelets 267; Potassium 4.0; Sodium 136  Recent Lipid Panel    Component Value Date/Time   CHOL 180 11/14/2016 1513   TRIG 117 11/14/2016 1513   HDL 52 11/14/2016 1513   CHOLHDL 3.5 11/14/2016 1513   VLDL 23 11/14/2016 1513   LDLCALC 105 (H) 11/14/2016 1513     Risk Assessment/Calculations:      Physical Exam:    VS:  BP 116/74   Pulse (!) 50   Ht 6' (1.829 m)   Wt 162 lb 6.4 oz (73.7 kg)   SpO2 97%   BMI 22.03 kg/m     Wt Readings from Last 3 Encounters:  01/16/21 162 lb 6.4 oz (73.7 kg)  12/23/20 164 lb 6 oz (74.6 kg)  11/04/20 160 lb (72.6 kg)     GEN:  Well nourished, well developed in no acute distress HEENT: Normal NECK: No JVD; No carotid bruits LYMPHATICS: No lymphadenopathy CARDIAC: Bradycardic, regular, no murmurs, rubs, gallops RESPIRATORY:  Clear to auscultation without rales, wheezing or rhonchi  ABDOMEN: Soft, non-tender, non-distended MUSCULOSKELETAL:  No edema; No deformity  SKIN: Warm and dry NEUROLOGIC:  Alert and oriented x 3 PSYCHIATRIC:  Normal affect   ASSESSMENT:    1. Pain of upper abdomen   2. Coronary artery disease involving native coronary artery of native heart, unspecified whether angina present   3. Primary hypertension   4. Pre-op evaluation    PLAN:    In order of problems listed above:  1. Epigastric pain, history of CAD/stent.  Get echo, Myoview to evaluate presence of ischemia. 2. CAD/PCI to RCA 2018, continue aspirin, start Lipitor 40 mg daily 3. Hypertension, BP controlled.   Bradycardic, decrease Toprol-XL to 12.5 mg daily. 4. Preop eval, epigastric/abdominal pain.  Echo and Lexiscan as above.  If no significant findings, okay to proceed with EGD from a cardiac perspective.  Follow-up after echo and Myoview.   Shared Decision Making/Informed Consent The risks [chest pain, shortness of breath, cardiac arrhythmias, dizziness, blood pressure fluctuations, myocardial infarction, stroke/transient ischemic attack, nausea, vomiting, allergic reaction, radiation exposure, metallic taste sensation and life-threatening complications (estimated to be 1 in 10,000)], benefits (risk stratification, diagnosing coronary artery disease, treatment guidance) and alternatives of a nuclear stress test were discussed in detail with Gabriel Grant and he agrees to proceed.       Medication Adjustments/Labs  and Tests Ordered: Current medicines are reviewed at length with the patient today.  Concerns regarding medicines are outlined above.  Orders Placed This Encounter  Procedures  . NM Myocar Multi W/Spect W/Wall Motion / EF  . EKG 12-Lead  . ECHOCARDIOGRAM COMPLETE   Meds ordered this encounter  Medications  . atorvastatin (LIPITOR) 40 MG tablet    Sig: Take 1 tablet (40 mg total) by mouth daily.    Dispense:  30 tablet    Refill:  5  . metoprolol succinate (TOPROL-XL) 25 MG 24 hr tablet    Sig: Take 0.5 tablets (12.5 mg total) by mouth daily.    Dispense:  30 tablet    Refill:  0    Patient Instructions  Medication Instructions:   Your physician has recommended you make the following change in your medication:   1.  DECREASE your Toprol XL to 12.5 MG daily.  2.  START taking Lipitor 40 MG daily.  *If you need a refill on your cardiac medications before your next appointment, please call your pharmacy*   Lab Work: None ordered If you have labs (blood work) drawn today and your tests are completely normal, you will receive your results only by: Marland Kitchen MyChart Message (if  you have MyChart) OR . A paper copy in the mail If you have any lab test that is abnormal or we need to change your treatment, we will call you to review the results.   Testing/Procedures:  1.  Your physician has requested that you have an echocardiogram. Echocardiography is a painless test that uses sound waves to create images of your heart. It provides your doctor with information about the size and shape of your heart and how well your heart's chambers and valves are working. This procedure takes approximately one hour. There are no restrictions for this procedure.   2.  [place]       Your caregiver has ordered a Stress Test with nuclear imaging. The purpose of this test is to evaluate the blood supply to your heart muscle. This procedure is referred to as a "Non-Invasive Stress Test." This is because other than having an IV started in your vein, nothing is inserted or "invades" your body. Cardiac stress tests are done to find areas of poor blood flow to the heart by determining the extent of coronary artery disease (CAD). Some patients exercise on a treadmill, which naturally increases the blood flow to your heart, while others who are  unable to walk on a treadmill due to physical limitations have a pharmacologic/chemical stress agent called Lexiscan . This medicine will mimic walking on a treadmill by temporarily increasing your coronary blood flow.      PLEASE REPORT TO [facility] MEDICAL MALL ENTRANCE   THE VOLUNTEERS AT THE FIRST DESK WILL DIRECT YOU WHERE TO GO     *Please note: these test may take anywhere between 2-4 hours to complete       Date of Procedure:_____________________________________   Arrival Time for Procedure:______________________________    PLEASE NOTIFY THE OFFICE AT LEAST 24 HOURS IN ADVANCE IF YOU ARE UNABLE TO KEEP YOUR APPOINTMENT.  [place] 24 HOURS IN ADVANCE IF YOU ARE UNABLE TO KEEP YOUR  APPOINTMENT. [phone]       How to prepare for your Myoview test:    1. Do not eat or drink after midnight  2. No caffeine for 24 hours prior to test  3. No  smoking 24 hours prior to test.  4. Unless instructed otherwise, Take your medication with a small sips of water.    5. No perfume, cologne or lotion.       Follow-Up: At [facility], you and your health needs are our priority.  As part of our continuing mission to provide you with exceptional heart care, we have created designated Provider Care Teams.  These Care Teams include your primary Cardiologist (physician) and Advanced Practice Providers (APPs -  Physician Assistants and Nurse Practitioners) who all work together to provide you with the care you need, when you need it.  We recommend signing up for the patient portal called "MyChart".  Sign up information is provided on this After Visit Summary.  MyChart is used to connect with patients for Virtual Visits (Telemedicine).  Patients are able to view lab/test results, encounter notes, upcoming appointments, etc.  Non-urgent messages can be sent to your provider as well.   To learn more about what you can do with MyChart, go to [email or web address].    Your next appointment:   Follow up after testing   The format for your next appointment:   In Person  Provider:   Kate Sable, MD   Other Instructions        Signed, Kate Sable, MD  01/16/2021 12:45 PM    [place]

## 2021-01-16 NOTE — Patient Instructions (Signed)
Medication Instructions:   Your physician has recommended you make the following change in your medication:   1.  DECREASE your Toprol XL to 12.5 MG daily.  2.  START taking Lipitor 40 MG daily.  *If you need a refill on your cardiac medications before your next appointment, please call your pharmacy*   Lab Work: None ordered If you have labs (blood work) drawn today and your tests are completely normal, you will receive your results only by: Marland Kitchen MyChart Message (if you have MyChart) OR . A paper copy in the mail If you have any lab test that is abnormal or we need to change your treatment, we will call you to review the results.   Testing/Procedures:  1.  Your physician has requested that you have an echocardiogram. Echocardiography is a painless test that uses sound waves to create images of your heart. It provides your doctor with information about the size and shape of your heart and how well your heart's chambers and valves are working. This procedure takes approximately one hour. There are no restrictions for this procedure.   2.  [place]       Your caregiver has ordered a Stress Test with nuclear imaging. The purpose of this test is to evaluate the blood supply to your heart muscle. This procedure is referred to as a "Non-Invasive Stress Test." This is because other than having an IV started in your vein, nothing is inserted or "invades" your body. Cardiac stress tests are done to find areas of poor blood flow to the heart by determining the extent of coronary artery disease (CAD). Some patients exercise on a treadmill, which naturally increases the blood flow to your heart, while others who are  unable to walk on a treadmill due to physical limitations have a pharmacologic/chemical stress agent called Lexiscan . This medicine will mimic walking on a treadmill by temporarily increasing your coronary blood flow.      PLEASE REPORT TO [facility] MEDICAL MALL ENTRANCE   THE VOLUNTEERS  AT THE FIRST DESK WILL DIRECT YOU WHERE TO GO     *Please note: these test may take anywhere between 2-4 hours to complete       Date of Procedure:_____________________________________   Arrival Time for Procedure:______________________________    PLEASE NOTIFY THE OFFICE AT LEAST 24 HOURS IN ADVANCE IF YOU ARE UNABLE TO KEEP YOUR APPOINTMENT.  [place] 24 HOURS IN ADVANCE IF YOU ARE UNABLE TO KEEP YOUR APPOINTMENT. [phone]       How to prepare for your Myoview test:    1. Do not eat or drink after midnight  2. No caffeine for 24 hours prior to test  3. No smoking 24 hours prior to test.  4. Unless instructed otherwise, Take your medication with a small sips of water.    5. No perfume, cologne or lotion.       Follow-Up: At [facility], you and your health needs are our priority.  As part of our continuing mission to provide you with exceptional heart care, we have created designated Provider Care Teams.  These Care Teams include your primary Cardiologist (physician) and Advanced Practice Providers (APPs -  Physician Assistants and Nurse Practitioners) who all work together to provide you with the care you need, when you need it.  We recommend signing up for the patient portal called "MyChart".  Sign up information is provided on this After Visit Summary.  MyChart  is used to connect with patients for Virtual Visits (Telemedicine).  Patients are able to view lab/test results, encounter notes, upcoming appointments, etc.  Non-urgent messages can be sent to your provider as well.   To learn more about what you can do with MyChart, go to [email or web address].    Your next appointment:   Follow up after testing   The format for your next appointment:   In Person  Provider:   Kate Sable, MD   Other Instructions

## 2021-01-20 ENCOUNTER — Other Ambulatory Visit: Payer: Self-pay

## 2021-01-20 ENCOUNTER — Encounter
Admission: RE | Admit: 2021-01-20 | Discharge: 2021-01-20 | Disposition: A | Discharge status: Home / Self Care | Payer: Federal, State, Local not specified - PPO | Source: Ambulatory Visit | Attending: Cardiology | Admitting: Cardiology

## 2021-01-20 DIAGNOSIS — I2510 Atherosclerotic heart disease of native coronary artery without angina pectoris: Secondary | ICD-10-CM

## 2021-01-20 MED ORDER — TECHNETIUM TC 99M TETROFOSMIN IV KIT
10.0100 | PACK | Freq: Once | INTRAVENOUS | Status: AC | PRN
  Administered 2021-01-20: 10.01 via INTRAVENOUS

## 2021-01-27 ENCOUNTER — Other Ambulatory Visit: Payer: Self-pay

## 2021-01-27 ENCOUNTER — Ambulatory Visit
Admission: RE | Admit: 2021-01-27 | Discharge: 2021-01-27 | Disposition: A | Discharge status: Home / Self Care | Payer: Federal, State, Local not specified - PPO | Source: Ambulatory Visit | Attending: Cardiology | Admitting: Cardiology

## 2021-01-27 DIAGNOSIS — I700 Atherosclerosis of aorta: Secondary | ICD-10-CM | POA: Diagnosis not present

## 2021-01-27 DIAGNOSIS — I2510 Atherosclerotic heart disease of native coronary artery without angina pectoris: Secondary | ICD-10-CM | POA: Insufficient documentation

## 2021-01-27 DIAGNOSIS — Z955 Presence of coronary angioplasty implant and graft: Secondary | ICD-10-CM | POA: Insufficient documentation

## 2021-01-27 LAB — NM MYOCAR MULTI W/SPECT W/WALL MOTION / EF
LV dias vol: 78 mL (ref 62–150)
LV sys vol: 24 mL
Peak HR: 98 {beats}/min
Percent HR: 60 %
Rest HR: 48 {beats}/min
SDS: 0
SRS: 0
SSS: 0
TID: 0.89

## 2021-01-27 MED ORDER — TECHNETIUM TC 99M TETROFOSMIN IV KIT
9.9400 | PACK | Freq: Once | INTRAVENOUS | Status: AC | PRN
  Administered 2021-01-27: 9.94 via INTRAVENOUS

## 2021-01-27 MED ORDER — REGADENOSON 0.4 MG/5ML IV SOLN
0.4000 mg | Freq: Once | INTRAVENOUS | Status: AC
  Administered 2021-01-27: 0.4 mg via INTRAVENOUS

## 2021-01-27 MED ORDER — TECHNETIUM TC 99M TETROFOSMIN IV KIT
30.0000 | PACK | Freq: Once | INTRAVENOUS | Status: AC | PRN
  Administered 2021-01-27: 32.4 via INTRAVENOUS

## 2021-02-24 ENCOUNTER — Ambulatory Visit (INDEPENDENT_AMBULATORY_CARE_PROVIDER_SITE_OTHER): Payer: Federal, State, Local not specified - PPO

## 2021-02-24 ENCOUNTER — Other Ambulatory Visit: Payer: Self-pay

## 2021-02-24 DIAGNOSIS — I2510 Atherosclerotic heart disease of native coronary artery without angina pectoris: Secondary | ICD-10-CM

## 2021-02-24 LAB — ECHOCARDIOGRAM COMPLETE
AR max vel: 20.99 cm2
AV Area VTI: 1.94 cm2
AV Area mean vel: 2.09 cm2
AV Peak grad: 0.1 mmHg
Ao pk vel: 0.13 m/s
Area-P 1/2: 2.77 cm2
P 1/2 time: 80 msec
S' Lateral: 2.9 cm

## 2021-03-03 ENCOUNTER — Encounter: Payer: Self-pay | Admitting: Gastroenterology

## 2021-03-03 ENCOUNTER — Ambulatory Visit: Payer: Federal, State, Local not specified - PPO | Admitting: Gastroenterology

## 2021-03-03 ENCOUNTER — Ambulatory Visit: Payer: Federal, State, Local not specified - PPO | Admitting: Cardiology

## 2021-03-03 ENCOUNTER — Other Ambulatory Visit: Payer: Self-pay

## 2021-03-03 ENCOUNTER — Encounter: Payer: Self-pay | Admitting: Cardiology

## 2021-03-03 VITALS — BP 126/64 | HR 60 | Ht 71.0 in | Wt 160.0 lb

## 2021-03-03 VITALS — BP 147/87 | HR 49 | Temp 98.1°F | Ht 72.0 in | Wt 160.0 lb

## 2021-03-03 DIAGNOSIS — F17200 Nicotine dependence, unspecified, uncomplicated: Secondary | ICD-10-CM | POA: Diagnosis not present

## 2021-03-03 DIAGNOSIS — K8681 Exocrine pancreatic insufficiency: Secondary | ICD-10-CM

## 2021-03-03 DIAGNOSIS — Z01810 Encounter for preprocedural cardiovascular examination: Secondary | ICD-10-CM

## 2021-03-03 DIAGNOSIS — I10 Essential (primary) hypertension: Secondary | ICD-10-CM | POA: Diagnosis not present

## 2021-03-03 DIAGNOSIS — R634 Abnormal weight loss: Secondary | ICD-10-CM

## 2021-03-03 DIAGNOSIS — I2510 Atherosclerotic heart disease of native coronary artery without angina pectoris: Secondary | ICD-10-CM

## 2021-03-03 DIAGNOSIS — Z01818 Encounter for other preprocedural examination: Secondary | ICD-10-CM

## 2021-03-03 NOTE — Progress Notes (Signed)
Cephas Darby, MD [address]  [place]  [place], [place] [address]  Main: [phone]  Fax: [phone]    Gastroenterology Consultation  Referring Provider:     Marygrace Drought, MD Primary Care Physician:  Marygrace Drought, MD Primary Gastroenterologist:  Dr. Cephas Darby Reason for Consultation:     Chronic epigastric pain, postprandial diarrhea        HPI:   Gabriel Grant is a 59 y.o. male referred by Dr. Marygrace Drought, MD  for consultation & management of chronic epigastric pain, postprandial diarrhea.  Patient reports approximately 3 months history of constant pain in the epigastric region associated with postprandial urgency and diarrhea about 3-4 times daily.  He denies any rectal bleeding.  He does report some abdominal bloating.  He reports worsening of symptoms with fried foods, fatty foods.  He does have history of 40+ years of smoking tobacco.  He does have history of coronary disease, s/p PCI, previously on Brilinta which has been discontinued per his cardiology recommendations, currently on aspirin 81 mg daily.  He had history of cholecystectomy in 01/2019 at [facility] for epigastric pain, symptomatic cholelithiasis.  He reports that the pain was not relieved after cholecystectomy.  He does report gradual weight loss as well.  He was recently in the ER secondary to chest pain as well as epigastric pain, he thought he had another heart attack, troponins were negative, he underwent CT abdomen and pelvis which revealed normal pancreas, does have colonic diverticulosis.  Most recent labs revealed mild leukocytosis, hemoglobin 14.2, normal platelets, normal LFTs, normal lipase  He denies alcohol use  Follow-up visit 03/03/2021: Patient is diagnosed with exocrine pancreatic insufficiency, his pancreatic fecal elastase levels are 152.  I started him on Creon 72,000 units with each meal and 32,000 units with snack.  Patient continues to lose weight.  However, he did notice  good improvement in his diarrhea, bloating and abdominal cramps.  He did notice worsening of his symptoms next day morning when he skipped taking Creon for dinner.  He continues to smoke cigarettes.  He manages to eat only 2 meals a day and maybe 1 snack a day due to his work schedule  Patient underwent cardiac work-up and is cleared to undergo GI procedures if needed  NSAIDs: None  Antiplts/Anticoagulants/Anti thrombotics: Aspirin 81 mg  GI Procedures: None Cologuard negative in 2021 He denies family history of pancreatic cancer, GI malignancy  Past Medical History:  Diagnosis Date   CAD (coronary artery disease)    Hyperlipidemia    Hypertension     Past Surgical History:  Procedure Laterality Date   CORONARY STENT INTERVENTION Right 11/15/2016   Procedure: Coronary Angiogram;  Surgeon: Dionisio David, MD;  Location: [place] [facility];  Service: Cardiovascular;  Laterality: Right;   CORONARY STENT INTERVENTION N/A 11/15/2016   Procedure: Coronary Stent Intervention;  Surgeon: Yolonda Kida, MD;  Location: [place] [facility];  Service: Cardiovascular;  Laterality: N/A;   LEFT HEART CATH AND CORONARY ANGIOGRAPHY Right 11/15/2016   Procedure: Left Heart Cath;  Surgeon: Dionisio David, MD;  Location: [place] [facility];  Service: Cardiovascular;  Laterality: Right;   TONSILLECTOMY      Current Outpatient Medications:    aspirin EC 81 MG EC tablet, Take 1 tablet (81 mg total) by mouth daily., Disp: 30 tablet, Rfl: 0   atorvastatin (LIPITOR) 40 MG tablet, Take 1 tablet (40 mg total) by mouth daily., Disp: 30 tablet,  Rfl: 5   diphenhydramine-acetaminophen (TYLENOL PM) 25-500 MG TABS tablet, Take 1 tablet by mouth at bedtime., Disp: , Rfl:    lipase/protease/amylase (CREON) 36000 UNITS CPEP capsule, Take 2 capsule with the first bite of each meal and 1 capsule with the first bite of each snack, Disp: 240 capsule, Rfl: 1   loratadine (CLARITIN) 10 MG tablet, Take 10 mg by mouth  daily., Disp: , Rfl:    metoprolol succinate (TOPROL-XL) 25 MG 24 hr tablet, Take 0.5 tablets (12.5 mg total) by mouth daily., Disp: 30 tablet, Rfl: 0   nicotine polacrilex (COMMIT) 2 MG lozenge, Place inside cheek., Disp: , Rfl:    nitroGLYCERIN (NITROSTAT) 0.4 MG SL tablet, Place 1 tablet (0.4 mg total) under the tongue every 5 (five) minutes as needed for chest pain., Disp: 30 tablet, Rfl: 0   pantoprazole (PROTONIX) 40 MG tablet, TAKE 1 TABLET(40 MG) BY MOUTH DAILY, Disp: , Rfl:    sildenafil (REVATIO) 20 MG tablet, Take 20 mg by mouth every morning., Disp: , Rfl:    Family History  Problem Relation Age of Onset   Cancer Mother        breast   Hyperlipidemia Mother    Hypertension Father    CAD Father    CAD Paternal Grandfather      Social History   Tobacco Use   Smoking status: Every Day    Packs/day: 0.50    Pack years: 0.00    Types: Cigarettes   Smokeless tobacco: Never   Tobacco comments:    trying to quit  Vaping Use   Vaping Use: Never used  Substance Use Topics   Alcohol use: No   Drug use: No    Allergies as of 03/03/2021 - Review Complete 03/03/2021  Allergen Reaction Noted   Codeine Other (See Comments) 02/28/2016   Sulfa antibiotics Rash 04/15/2020    Review of Systems:    All systems reviewed and negative except where noted in HPI.   Physical Exam:  BP (!) 147/87 (BP Location: Left Arm, Patient Position: Sitting, Cuff Size: Normal)   Pulse (!) 49   Temp 98.1 F (36.7 C) (Oral)   Ht 6' (1.829 m)   Wt 160 lb (72.6 kg)   BMI 21.70 kg/m  No LMP for male patient.  General:   Alert, thin built, moderately nourished, pleasant and cooperative in NAD Head:  Normocephalic and atraumatic. Eyes:  Sclera clear, no icterus.   Conjunctiva pink. Ears:  Normal auditory acuity. Nose:  No deformity, discharge, or lesions. Mouth:  No deformity or lesions,oropharynx pink & moist. Neck:  Supple; no masses or thyromegaly. Lungs:  Respirations even and  unlabored.  Clear throughout to auscultation.   No wheezes, crackles, or rhonchi. No acute distress. Heart:  Regular rate and rhythm; no murmurs, clicks, rubs, or gallops. Abdomen:  Normal bowel sounds. Soft, epigastric tenderness and mildly distended, tympanic to percussion without masses, hepatosplenomegaly or hernias noted.  No guarding or rebound tenderness.   Rectal: Not performed Msk:  Symmetrical without gross deformities. Good, equal movement & strength bilaterally. Pulses:  Normal pulses noted. Extremities:  No clubbing or edema.  No cyanosis. Neurologic:  Alert and oriented x3;  grossly normal neurologically. Skin:  Intact without significant lesions or rashes. No jaundice. Psych:  Alert and cooperative. Normal mood and affect.  Imaging Studies: Reviewed  Assessment and Plan:   Gabriel Grant is a 60 y.o. male with history of cholecystectomy at [facility] on 01/29/2019, coronary artery disease  s/p PCI and stent placement, previously on Brilinta, currently maintained on aspirin 81 mg daily, 40+ years of smoking tobacco is seen in consultation for chronic epigastric pain associated with postprandial urgency and abdominal bloating, gradual weight loss.  His symptoms are secondary to chronic pancreatitis resulting in exocrine pancreatic insufficiency.    Exocrine pancreatic insufficiency based on pancreatic fecal elastase levels H. pylori stool antigen negative Advised patient to increase Creon to 36,000 units 3 capsules with each meal and 2 with snack  Reiterated on high-protein diet  If he continues to lose weight despite increase in the dosage as above in next 4 to 6 weeks, he will contact my office, will schedule upper endoscopy as well as colonoscopy with TI evaluation and biopsies Discussed about smoking cessation Advised to start taking vitamin D supplements as well as multivitamin with minerals daily   Follow up in 3 months   Cephas Darby, MD

## 2021-03-03 NOTE — Progress Notes (Signed)
Cardiology Office Note:    Date:  03/03/2021   ID:  Gabriel Grant, DOB 20-Feb-1961, MRN [id number]  PCP:  Marygrace Drought, MD   [facility] Providers Cardiologist:  Kate Sable, MD     Referring MD: Marygrace Drought, MD   Chief Complaint  Patient presents with   Other    Follow up ECHO and Myoview. Meds reviewed verbally with patient.      History of Present Illness:    Gabriel Grant is a 60 y.o. male with a hx of hypertension, CAD/PCI to dRCA 2018, current smoker x40+ years who presents for follow-up.  He was previously seen due to history of CAD and preop evaluation.  He has a history of epigastric pain, possible peptic ulcer disease, EGD is being planned.  Due to history of CAD, cardiac eval was recommended.  He underwent echocardiogram and Lexiscan Myoview to evaluate cardiac function and presence of ischemia.  He still smokes, is working on quitting.  He has no other concerns at this time.  Heart rates were low at last visit, Toprol-XL was reduced, with improvement in heart rate currently around 60s beats per minute.   Past Medical History:  Diagnosis Date   CAD (coronary artery disease)    Hyperlipidemia    Hypertension     Past Surgical History:  Procedure Laterality Date   CORONARY STENT INTERVENTION Right 11/15/2016   Procedure: Coronary Angiogram;  Surgeon: Dionisio David, MD;  Location: [place] [facility];  Service: Cardiovascular;  Laterality: Right;   CORONARY STENT INTERVENTION N/A 11/15/2016   Procedure: Coronary Stent Intervention;  Surgeon: Yolonda Kida, MD;  Location: [place] [facility];  Service: Cardiovascular;  Laterality: N/A;   LEFT HEART CATH AND CORONARY ANGIOGRAPHY Right 11/15/2016   Procedure: Left Heart Cath;  Surgeon: Dionisio David, MD;  Location: [place] [facility];  Service: Cardiovascular;  Laterality: Right;   TONSILLECTOMY      Current Medications: Current Meds  Medication Sig   aspirin EC 81 MG EC tablet Take 1 tablet (81  mg total) by mouth daily.   atorvastatin (LIPITOR) 40 MG tablet Take 1 tablet (40 mg total) by mouth daily.   diphenhydramine-acetaminophen (TYLENOL PM) 25-500 MG TABS tablet Take 1 tablet by mouth at bedtime.   lipase/protease/amylase (CREON) 36000 UNITS CPEP capsule Take 2 capsule with the first bite of each meal and 1 capsule with the first bite of each snack   loratadine (CLARITIN) 10 MG tablet Take 10 mg by mouth daily.   metoprolol succinate (TOPROL-XL) 25 MG 24 hr tablet Take 0.5 tablets (12.5 mg total) by mouth daily.   nicotine polacrilex (COMMIT) 2 MG lozenge Place inside cheek.   nitroGLYCERIN (NITROSTAT) 0.4 MG SL tablet Place 1 tablet (0.4 mg total) under the tongue every 5 (five) minutes as needed for chest pain.   pantoprazole (PROTONIX) 40 MG tablet TAKE 1 TABLET(40 MG) BY MOUTH DAILY   sildenafil (REVATIO) 20 MG tablet Take 20 mg by mouth every morning.     Allergies:   Codeine and Sulfa antibiotics   Social History   Socioeconomic History   Marital status: Married    Spouse name: Not on file   Number of children: Not on file   Years of education: Not on file   Highest education level: Not on file  Occupational History   Not on file  Tobacco Use   Smoking status: Every Day    Packs/day: 0.50    Pack years:  0.00    Types: Cigarettes   Smokeless tobacco: Never   Tobacco comments:    trying to quit  Vaping Use   Vaping Use: Never used  Substance and Sexual Activity   Alcohol use: No   Drug use: No   Sexual activity: Not on file  Other Topics Concern   Not on file  Social History Narrative   Not on file   Social Determinants of Health   Financial Resource Strain: Not on file  Food Insecurity: Not on file  Transportation Needs: Not on file  Physical Activity: Not on file  Stress: Not on file  Social Connections: Not on file     Family History: The patient's family history includes CAD in his father and paternal grandfather; Cancer in his mother;  Hyperlipidemia in his mother; Hypertension in his father.  ROS:   Please see the history of present illness.     All other systems reviewed and are negative.  EKGs/Labs/Other Studies Reviewed:    The following studies were reviewed today:   EKG:  EKG is  ordered today.  The ekg ordered today demonstrates sinus bradycardia, heart rate 50  Recent Labs: 11/04/2020: ALT 18; BUN 15; Creatinine, Ser 1.14; Hemoglobin 14.2; Platelets 267; Potassium 4.0; Sodium 136  Recent Lipid Panel    Component Value Date/Time   CHOL 180 11/14/2016 1513   TRIG 117 11/14/2016 1513   HDL 52 11/14/2016 1513   CHOLHDL 3.5 11/14/2016 1513   VLDL 23 11/14/2016 1513   LDLCALC 105 (H) 11/14/2016 1513     Risk Assessment/Calculations:      Physical Exam:    VS:  BP 126/64 (BP Location: Left Arm, Patient Position: Sitting, Cuff Size: Normal)   Pulse 60   Ht 5\' 11"  (1.803 m)   Wt 160 lb (72.6 kg)   SpO2 98%   BMI 22.32 kg/m     Wt Readings from Last 3 Encounters:  03/03/21 160 lb (72.6 kg)  03/03/21 160 lb (72.6 kg)  01/16/21 162 lb 6.4 oz (73.7 kg)     GEN:  Well nourished, well developed in no acute distress HEENT: Normal NECK: No JVD; No carotid bruits LYMPHATICS: No lymphadenopathy CARDIAC: Bradycardic, regular, no murmurs, rubs, gallops RESPIRATORY:  Clear to auscultation without rales, wheezing or rhonchi  ABDOMEN: Soft, non-tender, non-distended MUSCULOSKELETAL:  No edema; No deformity  SKIN: Warm and dry NEUROLOGIC:  Alert and oriented x 3 PSYCHIATRIC:  Normal affect   ASSESSMENT:    1. Coronary artery disease involving native coronary artery of native heart, unspecified whether angina present   2. Primary hypertension   3. Smoking   4. Pre-op evaluation     PLAN:    In order of problems listed above:  CAD/PCI to RCA 2018, continue aspirin, Lipitor 40 mg daily.  Echo with preserved EF, 60 to 65%, Lexiscan Myoview with no evidence for ischemia. Hypertension, BP controlled.   Continue Toprol-XL 12.5 mg daily. Current smoker, cessation advised. Preop eval, epigastric/abdominal pain.  Echo and Lexiscan without evidence of ischemia, normal EF. , okay to proceed with EGD from a cardiac perspective.  Follow-up in 1 year.    Medication Adjustments/Labs and Tests Ordered: Current medicines are reviewed at length with the patient today.  Concerns regarding medicines are outlined above.  No orders of the defined types were placed in this encounter.  No orders of the defined types were placed in this encounter.   Patient Instructions  Medication Instructions:  Your physician recommends that  you continue on your current medications as directed. Please refer to the Current Medication list given to you today.  *If you need a refill on your cardiac medications before your next appointment, please call your pharmacy*   Lab Work: None ordered If you have labs (blood work) drawn today and your tests are completely normal, you will receive your results only by: [place] (if you have MyChart) OR A paper copy in the mail If you have any lab test that is abnormal or we need to change your treatment, we will call you to review the results.   Testing/Procedures: None ordered   Follow-Up: At [facility], you and your health needs are our priority.  As part of our continuing mission to provide you with exceptional heart care, we have created designated Provider Care Teams.  These Care Teams include your primary Cardiologist (physician) and Advanced Practice Providers (APPs -  Physician Assistants and Nurse Practitioners) who all work together to provide you with the care you need, when you need it.  We recommend signing up for the patient portal called "MyChart".  Sign up information is provided on this After Visit Summary.  MyChart is used to connect with patients for Virtual Visits (Telemedicine).  Patients are able to view lab/test results, encounter notes,  upcoming appointments, etc.  Non-urgent messages can be sent to your provider as well.   To learn more about what you can do with MyChart, go to [email or web address].    Your next appointment:   1 year(s)  The format for your next appointment:   In Person  Provider:   Kate Sable, MD   Other Instructions    Signed, Kate Sable, MD  03/03/2021 9:32 AM    [place]

## 2021-03-03 NOTE — Patient Instructions (Signed)

## 2021-03-27 ENCOUNTER — Other Ambulatory Visit: Payer: Self-pay | Admitting: Gastroenterology

## 2021-06-09 ENCOUNTER — Encounter: Payer: Self-pay | Admitting: Gastroenterology

## 2021-06-09 ENCOUNTER — Ambulatory Visit: Payer: Federal, State, Local not specified - PPO | Admitting: Gastroenterology

## 2021-06-09 ENCOUNTER — Other Ambulatory Visit: Payer: Self-pay

## 2021-06-09 VITALS — BP 156/83 | HR 54 | Temp 99.0°F | Ht 77.0 in | Wt 158.0 lb

## 2021-06-09 DIAGNOSIS — R1013 Epigastric pain: Secondary | ICD-10-CM | POA: Diagnosis not present

## 2021-06-09 DIAGNOSIS — R634 Abnormal weight loss: Secondary | ICD-10-CM

## 2021-06-09 DIAGNOSIS — K8681 Exocrine pancreatic insufficiency: Secondary | ICD-10-CM | POA: Diagnosis not present

## 2021-06-09 DIAGNOSIS — G8929 Other chronic pain: Secondary | ICD-10-CM

## 2021-06-09 MED ORDER — PEG 3350-KCL-NA BICARB-NACL 420 G PO SOLR: 4000.0000 mL | Freq: Once | ORAL | 0 refills | Status: AC

## 2021-06-09 NOTE — Progress Notes (Signed)
Gabriel Darby, MD [address]  [place]  [place], [place] [address]  Main: [phone]  Fax: [phone]    Gastroenterology Consultation  Referring Provider:     Marygrace Drought, MD Primary Care Physician:  Marygrace Drought, MD Primary Gastroenterologist:  Dr. Cephas Grant Reason for Consultation:     Chronic epigastric pain, weight loss, EPI        HPI:   Gabriel Grant is a 60 y.o. male referred by Dr. Marygrace Drought, MD  for consultation & management of chronic epigastric pain, postprandial diarrhea.  Patient reports approximately 3 months history of constant pain in the epigastric region associated with postprandial urgency and diarrhea about 3-4 times daily.  He denies any rectal bleeding.  He does report some abdominal bloating.  He reports worsening of symptoms with fried foods, fatty foods.  He does have history of 40+ years of smoking tobacco.  He does have history of coronary disease, s/p PCI, previously on Brilinta which has been discontinued per his cardiology recommendations, currently on aspirin 81 mg daily.  He had history of cholecystectomy in 01/2019 at [facility] for epigastric pain, symptomatic cholelithiasis.  He reports that the pain was not relieved after cholecystectomy.  He does report gradual weight loss as well.  He was recently in the ER secondary to chest pain as well as epigastric pain, he thought he had another heart attack, troponins were negative, he underwent CT abdomen and pelvis which revealed normal pancreas, does have colonic diverticulosis.  Most recent labs revealed mild leukocytosis, hemoglobin 14.2, normal platelets, normal LFTs, normal lipase  He denies alcohol use  Follow-up visit 03/03/2021: Patient is diagnosed with exocrine pancreatic insufficiency, his pancreatic fecal elastase levels are 152.  I started him on Creon 72,000 units with each meal and 32,000 units with snack.  Patient continues to lose weight.  However, he did notice good  improvement in his diarrhea, bloating and abdominal cramps.  He did notice worsening of his symptoms next day morning when he skipped taking Creon for dinner.  He continues to smoke cigarettes.  He manages to eat only 2 meals a day and maybe 1 snack a day due to his work schedule  Follow-up visit 06/09/2021 Patient reports that he is weight loss has more or less stabilized.  His diarrhea has resolved.  He describes his bowel movements as 4 out of 5 and Bristol stool scale.  However, he reports ongoing epigastric pain and is interested to undergo endoscopic evaluation at this time.  He continues to have only 2 meals a day and 1 snack.  Patient underwent cardiac work-up and is cleared to undergo GI procedures if needed  NSAIDs: None  Antiplts/Anticoagulants/Anti thrombotics: Aspirin 81 mg  GI Procedures: None Cologuard negative in 2021 He denies family history of pancreatic cancer, GI malignancy  Past Medical History:  Diagnosis Date   CAD (coronary artery disease)    Hyperlipidemia    Hypertension     Past Surgical History:  Procedure Laterality Date   CORONARY STENT INTERVENTION Right 11/15/2016   Procedure: Coronary Angiogram;  Surgeon: Dionisio David, MD;  Location: [place] [facility];  Service: Cardiovascular;  Laterality: Right;   CORONARY STENT INTERVENTION N/A 11/15/2016   Procedure: Coronary Stent Intervention;  Surgeon: Yolonda Kida, MD;  Location: [place] [facility];  Service: Cardiovascular;  Laterality: N/A;   LEFT HEART CATH AND CORONARY ANGIOGRAPHY Right 11/15/2016   Procedure: Left Heart Cath;  Surgeon: Emmit Pomfret  Humphrey Rolls, MD;  Location: [place] [facility];  Service: Cardiovascular;  Laterality: Right;   TONSILLECTOMY      Current Outpatient Medications:    aspirin EC 81 MG EC tablet, Take 1 tablet (81 mg total) by mouth daily., Disp: 30 tablet, Rfl: 0   CREON 36000-114000 units CPEP capsule, TAKE 2 CAPSULES WITH FIRST BITE OF EACH MEAL AND 1 CAPSULE WITH FIRST BITE  OF EACH SNACK, Disp: 240 capsule, Rfl: 1   diphenhydramine-acetaminophen (TYLENOL PM) 25-500 MG TABS tablet, Take 1 tablet by mouth at bedtime., Disp: , Rfl:    loratadine (CLARITIN) 10 MG tablet, Take 10 mg by mouth daily., Disp: , Rfl:    metoprolol succinate (TOPROL-XL) 25 MG 24 hr tablet, Take 0.5 tablets (12.5 mg total) by mouth daily., Disp: 30 tablet, Rfl: 0   nicotine polacrilex (COMMIT) 2 MG lozenge, Place inside cheek., Disp: , Rfl:    nitroGLYCERIN (NITROSTAT) 0.4 MG SL tablet, Place 1 tablet (0.4 mg total) under the tongue every 5 (five) minutes as needed for chest pain., Disp: 30 tablet, Rfl: 0   pantoprazole (PROTONIX) 40 MG tablet, TAKE 1 TABLET(40 MG) BY MOUTH DAILY, Disp: , Rfl:    polyethylene glycol-electrolytes (NULYTELY) 420 g solution, Take 4,000 mLs by mouth once for 1 dose., Disp: 4000 mL, Rfl: 0   sildenafil (REVATIO) 20 MG tablet, Take 20 mg by mouth every morning., Disp: , Rfl:    atorvastatin (LIPITOR) 40 MG tablet, Take 1 tablet (40 mg total) by mouth daily., Disp: 30 tablet, Rfl: 5   Family History  Problem Relation Age of Onset   Cancer Mother        breast   Hyperlipidemia Mother    Hypertension Father    CAD Father    CAD Paternal Grandfather      Social History   Tobacco Use   Smoking status: Every Day    Packs/day: 0.50    Types: Cigarettes   Smokeless tobacco: Never   Tobacco comments:    trying to quit  Vaping Use   Vaping Use: Never used  Substance Use Topics   Alcohol use: No   Drug use: No    Allergies as of 06/09/2021 - Review Complete 06/09/2021  Allergen Reaction Noted   Codeine Other (See Comments) 02/28/2016   Sulfa antibiotics Rash 04/15/2020    Review of Systems:    All systems reviewed and negative except where noted in HPI.   Physical Exam:  BP (!) 156/83 (BP Location: Left Arm, Patient Position: Sitting, Cuff Size: Normal)   Pulse (!) 54   Temp 99 F (37.2 C) (Oral)   Ht 6\' 5"  (1.956 m)   Wt 158 lb (71.7 kg)   BMI  18.74 kg/m  No LMP for male patient.  General:   Alert, thin built, moderately nourished, pleasant and cooperative in NAD Head:  Normocephalic and atraumatic. Eyes:  Sclera clear, no icterus.   Conjunctiva pink. Ears:  Normal auditory acuity. Nose:  No deformity, discharge, or lesions. Mouth:  No deformity or lesions,oropharynx pink & moist. Neck:  Supple; no masses or thyromegaly. Lungs:  Respirations even and unlabored.  Clear throughout to auscultation.   No wheezes, crackles, or rhonchi. No acute distress. Heart:  Regular rate and rhythm; no murmurs, clicks, rubs, or gallops. Abdomen:  Normal bowel sounds. Soft, epigastric tenderness and mildly distended, tympanic to percussion without masses, hepatosplenomegaly or hernias noted.  No guarding or rebound tenderness.   Rectal: Not performed Msk:  Symmetrical  without gross deformities. Good, equal movement & strength bilaterally. Pulses:  Normal pulses noted. Extremities:  No clubbing or edema.  No cyanosis. Neurologic:  Alert and oriented x3;  grossly normal neurologically. Skin:  Intact without significant lesions or rashes. No jaundice. Psych:  Alert and cooperative. Normal mood and affect.  Imaging Studies: Reviewed  Assessment and Plan:   Gabriel Grant is a 61 y.o. male with history of cholecystectomy at [facility] on 01/29/2019, coronary artery disease s/p PCI and stent placement, previously on Brilinta, currently maintained on aspirin 81 mg daily, 40+ years of smoking tobacco is seen in consultation for chronic epigastric pain associated with postprandial urgency and abdominal bloating, gradual weight loss.  His symptoms are secondary to chronic pancreatitis resulting in exocrine pancreatic insufficiency.    Exocrine pancreatic insufficiency based on pancreatic fecal elastase levels: Diarrhea has resolved Patient has ongoing epigastric pain, CT abdomen and pelvis on 11/04/2020 did not reveal any evidence of pancreatic lesions or chronic  pancreatitis.  However, suspect that patient has chronic epigastric pain secondary to underlying chronic pancreatitis H. pylori stool antigen negative Continue Creon to 36,000 units 3 capsules with each meal and 2 with snack  Reiterated on high-protein diet  Will schedule upper endoscopy as well as colonoscopy with TI evaluation and biopsies Discussed about smoking cessation Advised to start taking vitamin D supplements as well as multivitamin with minerals daily   Follow up in 4 months   Gabriel Darby, MD

## 2021-06-22 ENCOUNTER — Encounter: Payer: Self-pay | Admitting: Gastroenterology

## 2021-06-23 ENCOUNTER — Ambulatory Visit
Admission: RE | Admit: 2021-06-23 | Discharge: 2021-06-23 | Disposition: A | Discharge status: Home / Self Care | Payer: Federal, State, Local not specified - PPO | Attending: Gastroenterology | Admitting: Gastroenterology

## 2021-06-23 ENCOUNTER — Ambulatory Visit: Payer: Federal, State, Local not specified - PPO | Admitting: Certified Registered"

## 2021-06-23 ENCOUNTER — Encounter
Admission: RE | Disposition: A | Discharge status: Home / Self Care | Payer: Self-pay | Source: Home / Self Care | Attending: Gastroenterology

## 2021-06-23 ENCOUNTER — Encounter: Payer: Self-pay | Admitting: Gastroenterology

## 2021-06-23 DIAGNOSIS — Z882 Allergy status to sulfonamides status: Secondary | ICD-10-CM | POA: Diagnosis not present

## 2021-06-23 DIAGNOSIS — F17210 Nicotine dependence, cigarettes, uncomplicated: Secondary | ICD-10-CM | POA: Insufficient documentation

## 2021-06-23 DIAGNOSIS — K635 Polyp of colon: Secondary | ICD-10-CM | POA: Diagnosis not present

## 2021-06-23 DIAGNOSIS — R1013 Epigastric pain: Secondary | ICD-10-CM | POA: Insufficient documentation

## 2021-06-23 DIAGNOSIS — G8929 Other chronic pain: Secondary | ICD-10-CM | POA: Diagnosis not present

## 2021-06-23 DIAGNOSIS — K259 Gastric ulcer, unspecified as acute or chronic, without hemorrhage or perforation: Secondary | ICD-10-CM | POA: Diagnosis not present

## 2021-06-23 DIAGNOSIS — K2289 Other specified disease of esophagus: Secondary | ICD-10-CM | POA: Diagnosis not present

## 2021-06-23 DIAGNOSIS — K31A0 Gastric intestinal metaplasia, unspecified: Secondary | ICD-10-CM | POA: Insufficient documentation

## 2021-06-23 DIAGNOSIS — Z6822 Body mass index (BMI) 22.0-22.9, adult: Secondary | ICD-10-CM | POA: Insufficient documentation

## 2021-06-23 DIAGNOSIS — K3189 Other diseases of stomach and duodenum: Secondary | ICD-10-CM | POA: Insufficient documentation

## 2021-06-23 DIAGNOSIS — K529 Noninfective gastroenteritis and colitis, unspecified: Secondary | ICD-10-CM | POA: Diagnosis not present

## 2021-06-23 DIAGNOSIS — R634 Abnormal weight loss: Secondary | ICD-10-CM | POA: Diagnosis not present

## 2021-06-23 DIAGNOSIS — Z885 Allergy status to narcotic agent status: Secondary | ICD-10-CM | POA: Diagnosis not present

## 2021-06-23 DIAGNOSIS — D127 Benign neoplasm of rectosigmoid junction: Secondary | ICD-10-CM | POA: Diagnosis not present

## 2021-06-23 DIAGNOSIS — K5730 Diverticulosis of large intestine without perforation or abscess without bleeding: Secondary | ICD-10-CM | POA: Insufficient documentation

## 2021-06-23 DIAGNOSIS — K219 Gastro-esophageal reflux disease without esophagitis: Secondary | ICD-10-CM | POA: Diagnosis not present

## 2021-06-23 DIAGNOSIS — Z7982 Long term (current) use of aspirin: Secondary | ICD-10-CM | POA: Diagnosis not present

## 2021-06-23 DIAGNOSIS — K2270 Barrett's esophagus without dysplasia: Secondary | ICD-10-CM | POA: Diagnosis not present

## 2021-06-23 HISTORY — PX: ESOPHAGOGASTRODUODENOSCOPY: SHX5428

## 2021-06-23 HISTORY — PX: COLONOSCOPY WITH PROPOFOL: SHX5780

## 2021-06-23 SURGERY — COLONOSCOPY WITH PROPOFOL
Anesthesia: General

## 2021-06-23 MED ORDER — PROPOFOL 500 MG/50ML IV EMUL
INTRAVENOUS | Status: DC | PRN
  Administered 2021-06-23: 145 ug/kg/min via INTRAVENOUS

## 2021-06-23 MED ORDER — LIDOCAINE HCL (CARDIAC) PF 100 MG/5ML IV SOSY
PREFILLED_SYRINGE | INTRAVENOUS | Status: DC | PRN
  Administered 2021-06-23: 100 mg via INTRAVENOUS

## 2021-06-23 MED ORDER — PROPOFOL 10 MG/ML IV BOLUS
INTRAVENOUS | Status: DC | PRN
  Administered 2021-06-23: 40 mg via INTRAVENOUS

## 2021-06-23 MED ORDER — GLYCOPYRROLATE 0.2 MG/ML IJ SOLN
INTRAMUSCULAR | Status: DC | PRN
  Administered 2021-06-23: .2 mg via INTRAVENOUS

## 2021-06-23 MED ORDER — SODIUM CHLORIDE 0.9 % IV SOLN
INTRAVENOUS | Status: DC
  Administered 2021-06-23: 1000 mL via INTRAVENOUS

## 2021-06-23 NOTE — Transfer of Care (Signed)
Immediate Anesthesia Transfer of Care Note  Patient: Gabriel Grant  Procedure(s) Performed: COLONOSCOPY WITH PROPOFOL ESOPHAGOGASTRODUODENOSCOPY (EGD)  Patient Location: Endoscopy Unit  Anesthesia Type:General  Level of Consciousness: awake, drowsy and patient cooperative  Airway & Oxygen Therapy: Patient Spontanous Breathing and Patient connected to face mask oxygen  Post-op Assessment: Report given to RN and Post -op Vital signs reviewed and stable  Post vital signs: Reviewed and stable  Last Vitals:  Vitals Value Taken Time  BP 116/72 06/23/21 1047  Temp    Pulse 51 06/23/21 1050  Resp 21 06/23/21 1050  SpO2 100 % 06/23/21 1050  Vitals shown include unvalidated device data.  Last Pain:  Vitals:   06/23/21 1047  TempSrc:   PainSc: 0-No pain         Complications: No notable events documented.

## 2021-06-23 NOTE — Anesthesia Procedure Notes (Signed)
Procedure Name: General with mask airway Date/Time: 06/23/2021 10:10 AM Performed by: Kelton Pillar, CRNA Pre-anesthesia Checklist: Patient identified, Emergency Drugs available, Suction available and Patient being monitored Patient Re-evaluated:Patient Re-evaluated prior to induction Oxygen Delivery Method: Simple face mask Induction Type: IV induction Placement Confirmation: positive ETCO2 and CO2 detector Dental Injury: Teeth and Oropharynx as per pre-operative assessment

## 2021-06-23 NOTE — Op Note (Signed)
[facility] Gastroenterology Patient Name: Gabriel Grant Procedure Date: 06/23/2021 9:58 AM MRN: [id number] Account #: [id number] Date of Birth: [birth year] Admit Type: Outpatient Age: 60 Room: [facility] ENDO ROOM 3 Gender: Male Note Status: Finalized Instrument Name: Colonoscope [id number] Procedure:             Colonoscopy Indications:           This is the patient's first colonoscopy, Chronic                         diarrhea, Weight loss Providers:             Lin Landsman MD, MD Referring MD:          No Local Md, MD (Referring MD) Medicines:             General Anesthesia Complications:         No immediate complications. Estimated blood loss: None. Procedure:             Pre-Anesthesia Assessment:                        - Prior to the procedure, a History and Physical was                         performed, and patient medications and allergies were                         reviewed. The patient is competent. The risks and                         benefits of the procedure and the sedation options and                         risks were discussed with the patient. All questions                         were answered and informed consent was obtained.                         Patient identification and proposed procedure were                         verified by the physician, the nurse, the                         anesthesiologist, the anesthetist and the technician                         in the pre-procedure area in the procedure room in the                         endoscopy suite. Mental Status Examination: alert and                         oriented. Airway Examination: normal oropharyngeal                         airway and neck mobility. Respiratory Examination:  clear to auscultation. CV Examination: normal.                         Prophylactic Antibiotics: The patient does not require                         prophylactic antibiotics. Prior  Anticoagulants: The                         patient has taken no previous anticoagulant or                         antiplatelet agents. ASA Grade Assessment: III - A                         patient with severe systemic disease. After reviewing                         the risks and benefits, the patient was deemed in                         satisfactory condition to undergo the procedure. The                         anesthesia plan was to use general anesthesia.                         Immediately prior to administration of medications,                         the patient was re-assessed for adequacy to receive                         sedatives. The heart rate, respiratory rate, oxygen                         saturations, blood pressure, adequacy of pulmonary                         ventilation, and response to care were monitored                         throughout the procedure. The physical status of the                         patient was re-assessed after the procedure.                        After obtaining informed consent, the colonoscope was                         passed under direct vision. Throughout the procedure,                         the patient's blood pressure, pulse, and oxygen                         saturations were monitored continuously. The  Colonoscope was introduced through the anus and                         advanced to the 10 cm into the ileum. The colonoscopy                         was performed without difficulty. The patient                         tolerated the procedure well. The quality of the bowel                         preparation was fair. Findings:      The perianal and digital rectal examinations were normal. Pertinent       negatives include normal sphincter tone and no palpable rectal lesions.      The terminal ileum appeared normal.      Normal mucosa was found in the entire colon. Biopsies for histology were       taken  with a cold forceps from the entire colon for evaluation of       microscopic colitis.      Two semi-pedunculated polyps were found in the recto-sigmoid colon and       distal sigmoid colon. The polyps were 6 to 7 mm in size. These polyps       were removed with a cold snare. Resection and retrieval were complete.      Multiple diverticula were found in the recto-sigmoid colon and sigmoid       colon.      The retroflexed view of the distal rectum and anal verge was normal and       showed no anal or rectal abnormalities. Impression:            - Preparation of the colon was fair.                        - The examined portion of the ileum was normal.                        - Normal mucosa in the entire examined colon. Biopsied.                        - Two 6 to 7 mm polyps at the recto-sigmoid colon and                         in the distal sigmoid colon, removed with a cold                         snare. Resected and retrieved.                        - Diverticulosis in the recto-sigmoid colon and in the                         sigmoid colon.                        - The distal rectum and anal verge are normal on  retroflexion view. Recommendation:        - Discharge patient to home (with escort).                        - Resume previous diet today.                        - Continue present medications.                        - Await pathology results.                        - Return to my office as previously scheduled.                        - Repeat colonoscopy in 6 months because the bowel                         preparation was poor. Procedure Code(s):     --- Professional ---                        [phone], Colonoscopy, flexible; with removal of                         tumor(s), polyp(s), or other lesion(s) by snare                         technique                        45380, 15, Colonoscopy, flexible; with biopsy, single                         or  multiple Diagnosis Code(s):     --- Professional ---                        K63.5, Polyp of colon                        K52.9, Noninfective gastroenteritis and colitis,                         unspecified                        R63.4, Abnormal weight loss                        K57.30, Diverticulosis of large intestine without                         perforation or abscess without bleeding CPT copyright 2019 American Medical Association. All rights reserved. The codes documented in this report are preliminary and upon coder review may  be revised to meet current compliance requirements. Dr. Ulyess Mort Lin Landsman MD, MD 06/23/2021 10:46:11 AM This report has been signed electronically. Number of Addenda: 0 Note Initiated On: 06/23/2021 9:58 AM Scope Withdrawal Time: 0 hours 20 minutes 29 seconds  Total Procedure Duration: 0 hours 23 minutes 23 seconds  Estimated Blood Loss:  Estimated blood loss: none.  [facility]

## 2021-06-23 NOTE — Anesthesia Preprocedure Evaluation (Signed)
Anesthesia Evaluation  Patient identified by MRN, date of birth, ID band Patient awake    Reviewed: Allergy & Precautions, NPO status , Patient's Chart, lab work & pertinent test results, reviewed documented beta blocker date and time   Airway Mallampati: II  TM Distance: >3 FB Neck ROM: Full    Dental  (+) Poor Dentition, Missing,    Pulmonary neg pulmonary ROS, Current Smoker and Patient abstained from smoking.,    Pulmonary exam normal        Cardiovascular hypertension, Pt. on medications and Pt. on home beta blockers + CAD, + Past MI and + Cardiac Stents  Normal cardiovascular exam     Neuro/Psych negative neurological ROS  negative psych ROS   GI/Hepatic Neg liver ROS, Bowel prep,GERD  Medicated,  Endo/Other  negative endocrine ROS  Renal/GU negative Renal ROS  negative genitourinary   Musculoskeletal negative musculoskeletal ROS (+)   Abdominal   Peds negative pediatric ROS (+)  Hematology negative hematology ROS (+)   Anesthesia Other Findings CAD (coronary artery disease)  Hyperlipidemia    Hypertension       Reproductive/Obstetrics negative OB ROS                             Anesthesia Physical Anesthesia Plan  ASA: 3  Anesthesia Plan: General   Post-op Pain Management:    Induction: Intravenous  PONV Risk Score and Plan: 2 and TIVA and Propofol infusion  Airway Management Planned: Natural Airway and Nasal Cannula  Additional Equipment:   Intra-op Plan:   Post-operative Plan:   Informed Consent: I have reviewed the patients History and Physical, chart, labs and discussed the procedure including the risks, benefits and alternatives for the proposed anesthesia with the patient or authorized representative who has indicated his/her understanding and acceptance.       Plan Discussed with: CRNA, Anesthesiologist and Surgeon  Anesthesia Plan Comments:          Anesthesia Quick Evaluation

## 2021-06-23 NOTE — H&P (Signed)
Cephas Darby, MD [address]  [place], [place] [address]  Main: [phone]  Fax: [phone] Pager: [phone]  Primary Care Physician:  Marygrace Drought, MD Primary Gastroenterologist:  Dr. Cephas Darby  Pre-Procedure History & Physical: HPI:  DANYL DEEMS is a 60 y.o. male is here for an endoscopy and colonoscopy.   Past Medical History:  Diagnosis Date   CAD (coronary artery disease)    Hyperlipidemia    Hypertension     Past Surgical History:  Procedure Laterality Date   CARDIAC CATHETERIZATION     CORONARY STENT INTERVENTION Right 11/15/2016   Procedure: Coronary Angiogram;  Surgeon: Dionisio David, MD;  Location: [facility];  Service: Cardiovascular;  Laterality: Right;   CORONARY STENT INTERVENTION N/A 11/15/2016   Procedure: Coronary Stent Intervention;  Surgeon: Yolonda Kida, MD;  Location: [facility];  Service: Cardiovascular;  Laterality: N/A;   LEFT HEART CATH AND CORONARY ANGIOGRAPHY Right 11/15/2016   Procedure: Left Heart Cath;  Surgeon: Dionisio David, MD;  Location: [facility];  Service: Cardiovascular;  Laterality: Right;   TONSILLECTOMY      Prior to Admission medications   Medication Sig Start Date End Date Taking? Authorizing Provider  aspirin EC 81 MG EC tablet Take 1 tablet (81 mg total) by mouth daily. 11/16/16  Yes Wieting, Richard, MD  atorvastatin (LIPITOR) 40 MG tablet Take 1 tablet (40 mg total) by mouth daily. 01/16/21 06/23/21 Yes Agbor-Etang, Aaron Edelman, MD  CREON [id number] units CPEP capsule TAKE 2 CAPSULES WITH FIRST BITE OF [facility] MEAL AND 1 CAPSULE WITH FIRST BITE OF EACH SNACK 03/27/21  Yes Azula Zappia, Tally Due, MD  diphenhydramine-acetaminophen (TYLENOL PM) 25-500 MG TABS tablet Take 1 tablet by mouth at bedtime.   Yes [provider]  loratadine (CLARITIN) 10 MG tablet Take 10 mg by mouth daily.   Yes [provider]  metoprolol succinate (TOPROL-XL) 25 MG 24 hr  tablet Take 0.5 tablets (12.5 mg total) by mouth daily. 01/16/21  Yes Kate Sable, MD  nicotine polacrilex (COMMIT) 2 MG lozenge Place inside cheek. 04/27/18  Yes [provider]  nitroGLYCERIN (NITROSTAT) 0.4 MG SL tablet Place 1 tablet (0.4 mg total) under the tongue every 5 (five) minutes as needed for chest pain. 11/16/16  Yes Wieting, Richard, MD  pantoprazole (PROTONIX) 40 MG tablet TAKE 1 TABLET(40 MG) BY MOUTH DAILY 10/12/20  Yes [provider]  sildenafil (REVATIO) 20 MG tablet Take 20 mg by mouth every morning. 07/27/20  Yes [provider]    Allergies as of 06/09/2021 - Review Complete 06/09/2021  Allergen Reaction Noted   Codeine Other (See Comments) 02/28/2016   Sulfa antibiotics Rash 04/15/2020    Family History  Problem Relation Age of Onset   Cancer Mother        breast   Hyperlipidemia Mother    Hypertension Father    CAD Father    CAD Paternal Grandfather     Social History   Socioeconomic History   Marital status: Married    Spouse name: Not on file   Number of children: Not on file   Years of education: Not on file   Highest education level: Not on file  Occupational History   Not on file  Tobacco Use   Smoking status: Every Day    Packs/day: 0.50    Types: Cigarettes   Smokeless tobacco: Never   Tobacco comments:    trying to quit  Vaping Use   Vaping Use: Never used  Substance and Sexual Activity   Alcohol use: No   Drug use: No   Sexual activity: Not on file  Other Topics Concern   Not on file  Social History Narrative   Not on file   Social Determinants of Health   Financial Resource Strain: Not on file  Food Insecurity: Not on file  Transportation Needs: Not on file  Physical Activity: Not on file  Stress: Not on file  Social Connections: Not on file  Intimate Partner Violence: Not on file    Review of Systems: See HPI, otherwise negative ROS  Physical Exam: BP 119/68   Pulse 61   Temp (!) 97.4  F (36.3 C) (Temporal)   Resp 18   Ht 5\' 11"  (1.803 m)   Wt 73 kg   SpO2 100%   BMI 22.45 kg/m  General:   Alert,  pleasant and cooperative in NAD Head:  Normocephalic and atraumatic. Neck:  Supple; no masses or thyromegaly. Lungs:  Clear throughout to auscultation.    Heart:  Regular rate and rhythm. Abdomen:  Soft, nontender and nondistended. Normal bowel sounds, without guarding, and without rebound.   Neurologic:  Alert and  oriented x4;  grossly normal neurologically.  Impression/Plan: DEQUINCY BORN is here for an endoscopy and colonoscopy to be performed for unexplained weight loss, epigastric pain  Risks, benefits, limitations, and alternatives regarding  endoscopy and colonoscopy have been reviewed with the patient.  Questions have been answered.  All parties agreeable.   Sherri Sear, MD  06/23/2021, 9:41 AM

## 2021-06-23 NOTE — Op Note (Signed)
[facility] Gastroenterology Patient Name: Gabriel Grant Procedure Date: 06/23/2021 9:59 AM MRN: [id number] Account #: [id number] Date of Birth: 17-Nov-1960 Admit Type: Outpatient Age: 60 Room: [facility] ENDO ROOM 3 Gender: Male Note Status: Finalized Instrument Name: Upper Endoscope [id number] Procedure:             Upper GI endoscopy Indications:           Epigastric abdominal pain, Weight loss Providers:             Lin Landsman MD, MD Referring MD:          No Local Md, MD (Referring MD) Medicines:             General Anesthesia Complications:         No immediate complications. Estimated blood loss: None. Procedure:             Pre-Anesthesia Assessment:                        - Prior to the procedure, a History and Physical was                         performed, and patient medications and allergies were                         reviewed. The patient is competent. The risks and                         benefits of the procedure and the sedation options and                         risks were discussed with the patient. All questions                         were answered and informed consent was obtained.                         Patient identification and proposed procedure were                         verified by the physician, the nurse, the                         anesthesiologist, the anesthetist and the technician                         in the pre-procedure area in the procedure room in the                         endoscopy suite. Mental Status Examination: alert and                         oriented. Airway Examination: normal oropharyngeal                         airway and neck mobility. Respiratory Examination:                         clear to auscultation. CV Examination: normal.  Prophylactic Antibiotics: The patient does not require                         prophylactic antibiotics. Prior Anticoagulants: The                          patient has taken no previous anticoagulant or                         antiplatelet agents. ASA Grade Assessment: III - A                         patient with severe systemic disease. After reviewing                         the risks and benefits, the patient was deemed in                         satisfactory condition to undergo the procedure. The                         anesthesia plan was to use general anesthesia.                         Immediately prior to administration of medications,                         the patient was re-assessed for adequacy to receive                         sedatives. The heart rate, respiratory rate, oxygen                         saturations, blood pressure, adequacy of pulmonary                         ventilation, and response to care were monitored                         throughout the procedure. The physical status of the                         patient was re-assessed after the procedure.                        After obtaining informed consent, the endoscope was                         passed under direct vision. Throughout the procedure,                         the patient's blood pressure, pulse, and oxygen                         saturations were monitored continuously. The Endoscope                         was introduced through the mouth, and advanced to the  second part of duodenum. The upper GI endoscopy was                         accomplished without difficulty. The patient tolerated                         the procedure well. Findings:      The duodenal bulb and second portion of the duodenum were normal.       Biopsies were taken with a cold forceps for histology.      Diffuse nodular mucosa was found in the gastric body. Biopsies were       taken with a cold forceps for histology.      A few dispersed small erosions with stigmata of recent bleeding were       found in the gastric fundus.      The incisura and  gastric antrum were normal. Biopsies were taken with a       cold forceps for histology.      Esophagogastric landmarks were identified: the gastroesophageal junction       was found at 40 cm from the incisors.      One tongue of salmon-colored mucosa was present from 39 to 40 cm. No       other visible abnormalities were present. The maximum longitudinal       extent of these esophageal mucosal changes was 1 cm in length. Biopsies       were taken with a cold forceps for histology. Impression:            - Normal duodenal bulb and second portion of the                         duodenum. Biopsied.                        - Nodular mucosa in the gastric body. Biopsied.                        - Erosive gastropathy with stigmata of recent bleeding.                        - Normal incisura and antrum. Biopsied.                        - Esophagogastric landmarks identified.                        - Salmon-colored mucosa suspicious for short-segment                         Barrett's esophagus. Biopsied. Recommendation:        - Await pathology results.                        - Follow an antireflux regimen.                        - Proceed with colonoscopy as scheduled                        See colonoscopy report Procedure Code(s):     --- Professional ---  99357, Esophagogastroduodenoscopy, flexible,                         transoral; with biopsy, single or multiple Diagnosis Code(s):     --- Professional ---                        K22.8, Other specified diseases of esophagus                        K31.89, Other diseases of stomach and duodenum                        K92.2, Gastrointestinal hemorrhage, unspecified                        R63.4, Abnormal weight loss                        R10.13, Epigastric pain CPT copyright 2019 American Medical Association. All rights reserved. The codes documented in this report are preliminary and upon coder review may  be revised to  meet current compliance requirements. Dr. Ulyess Mort Lin Landsman MD, MD 06/23/2021 10:18:07 AM This report has been signed electronically. Number of Addenda: 0 Note Initiated On: 06/23/2021 9:59 AM Estimated Blood Loss:  Estimated blood loss: none.      [facility]

## 2021-06-23 NOTE — Anesthesia Postprocedure Evaluation (Signed)
Anesthesia Post Note  Patient: Gabriel Grant  Procedure(s) Performed: COLONOSCOPY WITH PROPOFOL ESOPHAGOGASTRODUODENOSCOPY (EGD)  Patient location during evaluation: Phase II Anesthesia Type: General Level of consciousness: awake and alert, awake and oriented Pain management: pain level controlled Vital Signs Assessment: post-procedure vital signs reviewed and stable Respiratory status: spontaneous breathing, nonlabored ventilation and respiratory function stable Cardiovascular status: blood pressure returned to baseline and stable Postop Assessment: no apparent nausea or vomiting Anesthetic complications: no   No notable events documented.   Last Vitals:  Vitals:   06/23/21 1057 06/23/21 1107  BP: (!) 143/87 127/89  Pulse: (!) 54 64  Resp: 20 20  Temp:    SpO2: 100% 100%    Last Pain:  Vitals:   06/23/21 1107  TempSrc:   PainSc: 0-No pain                 Phill Mutter

## 2021-06-25 ENCOUNTER — Encounter: Payer: Self-pay | Admitting: Gastroenterology

## 2021-06-25 ENCOUNTER — Telehealth: Payer: Self-pay

## 2021-06-25 LAB — SURGICAL PATHOLOGY

## 2021-06-25 NOTE — Telephone Encounter (Signed)
-----   Message from Lin Landsman, MD sent at 06/25/2021  4:24 PM EDT ----- Please inform patient that the pathology results from upper endoscopy came back normal.  He does have Barrett's esophagus from chronic acid reflux, nothing to worry, this is a benign condition.  However, he should continue taking Protonix 40 mg daily long-term.  The colonoscopy results also came back normal.    The epigastric pain and weight loss are secondary to his pancreas  I would like to see him for follow-up in 3 to 4 months  Rohini Vanga

## 2021-06-25 NOTE — Telephone Encounter (Signed)
CALLED PATIENT NO ANSWER LEFT VOICEMAIL FOR A CALL BACK ? ?

## 2021-06-26 ENCOUNTER — Telehealth: Payer: Self-pay

## 2021-06-26 MED ORDER — PANCRELIPASE (LIP-PROT-AMYL) 36000-114000 UNITS PO CPEP: ORAL_CAPSULE | ORAL | 4 refills | Status: DC

## 2021-06-26 NOTE — Telephone Encounter (Signed)
Called patient no answer left voicemail for a call back

## 2021-06-29 ENCOUNTER — Telehealth: Payer: Self-pay

## 2021-06-29 NOTE — Telephone Encounter (Signed)
SPOKE WITH PATIENT HE UNDERSTANDS HIS RESULTS AND WILL FOLLOW UP WITH [place] IN 3 TO 4 MONTHS

## 2021-06-29 NOTE — Telephone Encounter (Signed)
CALLED PATIENT NO ANSWER LEFT VOICEMAIL FOR A CALL BACK ? ?

## 2021-06-29 NOTE — Telephone Encounter (Signed)
-----   Message from Lin Landsman, MD sent at 06/25/2021  4:24 PM EDT ----- Please inform patient that the pathology results from upper endoscopy came back normal.  He does have Barrett's esophagus from chronic acid reflux, nothing to worry, this is a benign condition.  However, he should continue taking Protonix 40 mg daily long-term.  The colonoscopy results also came back normal.    The epigastric pain and weight loss are secondary to his pancreas  I would like to see him for follow-up in 3 to 4 months  Rohini Vanga

## 2021-06-30 DIAGNOSIS — M5442 Lumbago with sciatica, left side: Secondary | ICD-10-CM | POA: Diagnosis not present

## 2021-06-30 DIAGNOSIS — M9903 Segmental and somatic dysfunction of lumbar region: Secondary | ICD-10-CM | POA: Diagnosis not present

## 2021-06-30 DIAGNOSIS — M955 Acquired deformity of pelvis: Secondary | ICD-10-CM | POA: Diagnosis not present

## 2021-06-30 DIAGNOSIS — M9905 Segmental and somatic dysfunction of pelvic region: Secondary | ICD-10-CM | POA: Diagnosis not present

## 2021-07-02 DIAGNOSIS — M5442 Lumbago with sciatica, left side: Secondary | ICD-10-CM | POA: Diagnosis not present

## 2021-07-02 DIAGNOSIS — M9903 Segmental and somatic dysfunction of lumbar region: Secondary | ICD-10-CM | POA: Diagnosis not present

## 2021-07-02 DIAGNOSIS — M955 Acquired deformity of pelvis: Secondary | ICD-10-CM | POA: Diagnosis not present

## 2021-07-02 DIAGNOSIS — M9905 Segmental and somatic dysfunction of pelvic region: Secondary | ICD-10-CM | POA: Diagnosis not present

## 2021-07-07 DIAGNOSIS — M5442 Lumbago with sciatica, left side: Secondary | ICD-10-CM | POA: Diagnosis not present

## 2021-07-07 DIAGNOSIS — M9903 Segmental and somatic dysfunction of lumbar region: Secondary | ICD-10-CM | POA: Diagnosis not present

## 2021-07-07 DIAGNOSIS — M955 Acquired deformity of pelvis: Secondary | ICD-10-CM | POA: Diagnosis not present

## 2021-07-07 DIAGNOSIS — M9905 Segmental and somatic dysfunction of pelvic region: Secondary | ICD-10-CM | POA: Diagnosis not present

## 2021-07-14 DIAGNOSIS — M9903 Segmental and somatic dysfunction of lumbar region: Secondary | ICD-10-CM | POA: Diagnosis not present

## 2021-07-14 DIAGNOSIS — M9905 Segmental and somatic dysfunction of pelvic region: Secondary | ICD-10-CM | POA: Diagnosis not present

## 2021-07-14 DIAGNOSIS — M955 Acquired deformity of pelvis: Secondary | ICD-10-CM | POA: Diagnosis not present

## 2021-07-14 DIAGNOSIS — M5442 Lumbago with sciatica, left side: Secondary | ICD-10-CM | POA: Diagnosis not present

## 2021-07-17 ENCOUNTER — Other Ambulatory Visit: Payer: Self-pay | Admitting: *Deleted

## 2021-07-17 MED ORDER — ATORVASTATIN CALCIUM 40 MG PO TABS: 40.0000 mg | ORAL_TABLET | Freq: Every day | ORAL | 5 refills | Status: DC

## 2021-07-21 DIAGNOSIS — M5442 Lumbago with sciatica, left side: Secondary | ICD-10-CM | POA: Diagnosis not present

## 2021-07-21 DIAGNOSIS — M9905 Segmental and somatic dysfunction of pelvic region: Secondary | ICD-10-CM | POA: Diagnosis not present

## 2021-07-21 DIAGNOSIS — M955 Acquired deformity of pelvis: Secondary | ICD-10-CM | POA: Diagnosis not present

## 2021-07-21 DIAGNOSIS — M9903 Segmental and somatic dysfunction of lumbar region: Secondary | ICD-10-CM | POA: Diagnosis not present

## 2021-08-04 DIAGNOSIS — M9903 Segmental and somatic dysfunction of lumbar region: Secondary | ICD-10-CM | POA: Diagnosis not present

## 2021-08-04 DIAGNOSIS — M9905 Segmental and somatic dysfunction of pelvic region: Secondary | ICD-10-CM | POA: Diagnosis not present

## 2021-08-04 DIAGNOSIS — M955 Acquired deformity of pelvis: Secondary | ICD-10-CM | POA: Diagnosis not present

## 2021-08-04 DIAGNOSIS — M5442 Lumbago with sciatica, left side: Secondary | ICD-10-CM | POA: Diagnosis not present

## 2021-08-25 DIAGNOSIS — M955 Acquired deformity of pelvis: Secondary | ICD-10-CM | POA: Diagnosis not present

## 2021-08-25 DIAGNOSIS — M9905 Segmental and somatic dysfunction of pelvic region: Secondary | ICD-10-CM | POA: Diagnosis not present

## 2021-08-25 DIAGNOSIS — M9903 Segmental and somatic dysfunction of lumbar region: Secondary | ICD-10-CM | POA: Diagnosis not present

## 2021-08-25 DIAGNOSIS — M5442 Lumbago with sciatica, left side: Secondary | ICD-10-CM | POA: Diagnosis not present

## 2021-09-22 DIAGNOSIS — M9905 Segmental and somatic dysfunction of pelvic region: Secondary | ICD-10-CM | POA: Diagnosis not present

## 2021-09-22 DIAGNOSIS — M9903 Segmental and somatic dysfunction of lumbar region: Secondary | ICD-10-CM | POA: Diagnosis not present

## 2021-09-22 DIAGNOSIS — M5442 Lumbago with sciatica, left side: Secondary | ICD-10-CM | POA: Diagnosis not present

## 2021-09-22 DIAGNOSIS — M955 Acquired deformity of pelvis: Secondary | ICD-10-CM | POA: Diagnosis not present

## 2021-10-27 DIAGNOSIS — M955 Acquired deformity of pelvis: Secondary | ICD-10-CM | POA: Diagnosis not present

## 2021-10-27 DIAGNOSIS — M9905 Segmental and somatic dysfunction of pelvic region: Secondary | ICD-10-CM | POA: Diagnosis not present

## 2021-10-27 DIAGNOSIS — M9903 Segmental and somatic dysfunction of lumbar region: Secondary | ICD-10-CM | POA: Diagnosis not present

## 2021-10-27 DIAGNOSIS — M5442 Lumbago with sciatica, left side: Secondary | ICD-10-CM | POA: Diagnosis not present

## 2021-12-29 ENCOUNTER — Other Ambulatory Visit: Payer: Self-pay | Admitting: Gastroenterology

## 2022-01-13 ENCOUNTER — Other Ambulatory Visit: Payer: Self-pay

## 2022-01-13 MED ORDER — ATORVASTATIN CALCIUM 40 MG PO TABS: 40.0000 mg | ORAL_TABLET | Freq: Every day | ORAL | 0 refills | Status: DC

## 2022-01-13 NOTE — Telephone Encounter (Signed)
Refill sent for atorvastatin 40 mg daily for 90 day supply with 0 refills.  ?

## 2022-01-26 ENCOUNTER — Other Ambulatory Visit: Payer: Self-pay | Admitting: Gastroenterology

## 2022-01-28 ENCOUNTER — Telehealth: Payer: Self-pay | Admitting: Gastroenterology

## 2022-01-28 MED ORDER — PANCRELIPASE (LIP-PROT-AMYL) 36000-114000 UNITS PO CPEP: ORAL_CAPSULE | ORAL | 4 refills | Status: DC

## 2022-01-28 NOTE — Addendum Note (Signed)
Addended by: Ulyess Blossom L on: 01/28/2022 04:29 PM   Modules accepted: Orders

## 2022-01-28 NOTE — Telephone Encounter (Signed)
Patient requesting a call back in reference to medications.

## 2022-01-28 NOTE — Telephone Encounter (Signed)
Patient made appointment and wants creon sent to [facility]

## 2022-04-13 DIAGNOSIS — I10 Essential (primary) hypertension: Secondary | ICD-10-CM | POA: Diagnosis not present

## 2022-04-13 DIAGNOSIS — Z0001 Encounter for general adult medical examination with abnormal findings: Secondary | ICD-10-CM | POA: Diagnosis not present

## 2022-04-13 DIAGNOSIS — R1011 Right upper quadrant pain: Secondary | ICD-10-CM | POA: Diagnosis not present

## 2022-04-13 DIAGNOSIS — I2510 Atherosclerotic heart disease of native coronary artery without angina pectoris: Secondary | ICD-10-CM | POA: Diagnosis not present

## 2022-04-13 DIAGNOSIS — I252 Old myocardial infarction: Secondary | ICD-10-CM | POA: Diagnosis not present

## 2022-04-13 DIAGNOSIS — R42 Dizziness and giddiness: Secondary | ICD-10-CM | POA: Diagnosis not present

## 2022-04-13 DIAGNOSIS — R972 Elevated prostate specific antigen [PSA]: Secondary | ICD-10-CM | POA: Diagnosis not present

## 2022-04-13 DIAGNOSIS — R634 Abnormal weight loss: Secondary | ICD-10-CM

## 2022-04-19 ENCOUNTER — Other Ambulatory Visit: Payer: Self-pay

## 2022-04-19 MED ORDER — ATORVASTATIN CALCIUM 40 MG PO TABS: 40.0000 mg | ORAL_TABLET | Freq: Every day | ORAL | 0 refills | Status: DC

## 2022-05-04 ENCOUNTER — Ambulatory Visit: Payer: Federal, State, Local not specified - PPO | Admitting: Cardiology

## 2022-05-04 ENCOUNTER — Encounter: Payer: Self-pay | Admitting: Cardiology

## 2022-05-04 VITALS — BP 153/85 | HR 53 | Ht 70.0 in | Wt 157.1 lb

## 2022-05-04 DIAGNOSIS — F17200 Nicotine dependence, unspecified, uncomplicated: Secondary | ICD-10-CM | POA: Diagnosis not present

## 2022-05-04 DIAGNOSIS — I10 Essential (primary) hypertension: Secondary | ICD-10-CM | POA: Diagnosis not present

## 2022-05-04 DIAGNOSIS — I2510 Atherosclerotic heart disease of native coronary artery without angina pectoris: Secondary | ICD-10-CM

## 2022-05-04 DIAGNOSIS — R42 Dizziness and giddiness: Secondary | ICD-10-CM

## 2022-05-04 NOTE — Patient Instructions (Signed)
Medication Instructions:   Your physician recommends that you continue on your current medications as directed. Please refer to the Current Medication list given to you today.  *If you need a refill on your cardiac medications before your next appointment, please call your pharmacy*   Follow-Up: At [facility], you and your health needs are our priority.  As part of our continuing mission to provide you with exceptional heart care, we have created designated Provider Care Teams.  These Care Teams include your primary Cardiologist (physician) and Advanced Practice Providers (APPs -  Physician Assistants and Nurse Practitioners) who all work together to provide you with the care you need, when you need it.  We recommend signing up for the patient portal called "MyChart".  Sign up information is provided on this After Visit Summary.  MyChart is used to connect with patients for Virtual Visits (Telemedicine).  Patients are able to view lab/test results, encounter notes, upcoming appointments, etc.  Non-urgent messages can be sent to your provider as well.   To learn more about what you can do with MyChart, go to [email or web address].    Your next appointment:   1 year(s)  The format for your next appointment:   In Person  Provider:   Kate Sable, MD     Important Information About Sugar

## 2022-05-04 NOTE — Progress Notes (Signed)
Cardiology Office Note:    Date:  05/04/2022   ID:  Gabriel Grant, DOB [birth year], MRN [id number]  PCP:  Romualdo Bolk, FNP   [facility] Providers Cardiologist:  Kate Sable, MD     Referring MD: Marygrace Drought, MD   Chief Complaint  Patient presents with   Other    12 month f/u c/o dizziness and low HR. Meds reviewed verbally with pt.     History of Present Illness:    Gabriel Grant is a 61 y.o. male with a hx of hypertension, CAD/PCI to dRCA 2018, current smoker x40+ years who presents for follow-up.    Being seen for CAD, hypertension.  Previously was on Toprol-XL, noted to be bradycardic and dizzy.  Toprol-XL was stopped with improvement in symptoms.  Heart rates range around 50s to low 60s.  States getting dizzy only in the morning upon waking up, feels okay during the day.  Systolic blood pressure at home range in the 130s.  Currently not on any BP medications.  Prior notes Echo 02/2021 EF 60 to 65% Lexiscan Myoview 01/2021, low risk study, no ischemia     Past Medical History:  Diagnosis Date   CAD (coronary artery disease)    Hyperlipidemia    Hypertension     Past Surgical History:  Procedure Laterality Date   CARDIAC CATHETERIZATION     COLONOSCOPY WITH PROPOFOL N/A 06/23/2021   Procedure: COLONOSCOPY WITH PROPOFOL;  Surgeon: Lin Landsman, MD;  Location: [facility] ENDOSCOPY;  Service: Gastroenterology;  Laterality: N/A;   CORONARY STENT INTERVENTION Right 11/15/2016   Procedure: Coronary Angiogram;  Surgeon: Dionisio David, MD;  Location: [place] [facility];  Service: Cardiovascular;  Laterality: Right;   CORONARY STENT INTERVENTION N/A 11/15/2016   Procedure: Coronary Stent Intervention;  Surgeon: Yolonda Kida, MD;  Location: [place] [facility];  Service: Cardiovascular;  Laterality: N/A;   ESOPHAGOGASTRODUODENOSCOPY N/A 06/23/2021   Procedure: ESOPHAGOGASTRODUODENOSCOPY (EGD);  Surgeon: Lin Landsman, MD;  [facility]: [facility]  ENDOSCOPY;  Service: Gastroenterology;  Laterality: N/A;   LEFT HEART CATH AND CORONARY ANGIOGRAPHY Right 11/15/2016   Procedure: Left Heart Cath;  Surgeon: Dionisio David, MD;  Location: [place] [facility];  Service: Cardiovascular;  Laterality: Right;   TONSILLECTOMY      Current Medications: Current Meds  Medication Sig   aspirin EC 81 MG EC tablet Take 1 tablet (81 mg total) by mouth daily.   atorvastatin (LIPITOR) 40 MG tablet Take 1 tablet (40 mg total) by mouth daily.   diphenhydramine-acetaminophen (TYLENOL PM) 25-500 MG TABS tablet Take 1 tablet by mouth at bedtime.   lipase/protease/amylase (CREON) 36000 UNITS CPEP capsule TAKE 2 CAPSULES BY MOUTH WITH FIRST BITE OF EACH MEAL AND 1 CAPSULE WITH FIRST BITE OF EACH SNACK (Patient taking differently: TAKE 3 CAPSULES BY MOUTH WITH FIRST BITE OF EACH MEAL AND 2 CAPSULE WITH FIRST BITE OF EACH SNACK)   loratadine (CLARITIN) 10 MG tablet Take 10 mg by mouth daily.   NAPROXEN PO Take by mouth as needed.   nicotine polacrilex (COMMIT) 2 MG lozenge Place inside cheek.   nitroGLYCERIN (NITROSTAT) 0.4 MG SL tablet Place 1 tablet (0.4 mg total) under the tongue every 5 (five) minutes as needed for chest pain.   sildenafil (REVATIO) 20 MG tablet Take 20 mg by mouth every morning.     Allergies:   Codeine and Sulfa antibiotics   Social History   Socioeconomic History   Marital status: Married  Spouse name: Not on file   Number of children: Not on file   Years of education: Not on file   Highest education level: Not on file  Occupational History   Not on file  Tobacco Use   Smoking status: Every Day    Packs/day: 0.50    Types: Cigarettes   Smokeless tobacco: Never   Tobacco comments:    trying to quit  Vaping Use   Vaping Use: Never used  Substance and Sexual Activity   Alcohol use: No   Drug use: No   Sexual activity: Not on file  Other Topics Concern   Not on file  Social History Narrative   Not on file   Social  Determinants of Health   Financial Resource Strain: Not on file  Food Insecurity: Not on file  Transportation Needs: Not on file  Physical Activity: Not on file  Stress: Not on file  Social Connections: Not on file     Family History: The patient's family history includes CAD in his father and paternal grandfather; Cancer in his mother; Hyperlipidemia in his mother; Hypertension in his father.  ROS:   Please see the history of present illness.     All other systems reviewed and are negative.  EKGs/Labs/Other Studies Reviewed:    The following studies were reviewed today:   EKG:  EKG is  ordered today.  The ekg ordered today demonstrates sinus bradycardia, heart rate 53  Recent Labs: No results found for requested labs within last 365 days.  Recent Lipid Panel    Component Value Date/Time   CHOL 180 11/14/2016 1513   TRIG 117 11/14/2016 1513   HDL 52 11/14/2016 1513   CHOLHDL 3.5 11/14/2016 1513   VLDL 23 11/14/2016 1513   LDLCALC 105 (H) 11/14/2016 1513     Risk Assessment/Calculations:      Physical Exam:    VS:  BP (!) 153/85 (BP Location: Left Arm, Patient Position: Sitting, Cuff Size: Normal)   Pulse (!) 53   Ht '5\' 10"'$  (1.778 m)   Wt 157 lb 2 oz (71.3 kg)   SpO2 98%   BMI 22.55 kg/m     Wt Readings from Last 3 Encounters:  05/04/22 157 lb 2 oz (71.3 kg)  06/23/21 161 lb (73 kg)  06/09/21 158 lb (71.7 kg)     GEN:  Well nourished, well developed in no acute distress HEENT: Normal NECK: No JVD; No carotid bruits CARDIAC: Bradycardic, regular, no murmurs, rubs, gallops RESPIRATORY:  Clear to auscultation without rales, wheezing or rhonchi  ABDOMEN: Soft, non-tender, non-distended MUSCULOSKELETAL:  No edema; No deformity  SKIN: Warm and dry NEUROLOGIC:  Alert and oriented x 3 PSYCHIATRIC:  Normal affect   ASSESSMENT:    1. Dizziness   2. Coronary artery disease involving native coronary artery of native heart, unspecified whether angina present    3. Primary hypertension   4. Smoking      PLAN:    In order of problems listed above:  Dizziness in the morning only, may suggest vertigo, asymptomatic during the day.  EKG with sinus bradycardia in the 50s, no indication for PPM, this degree of bradycardia will not cause symptoms.  Orthostatic vitals today not consistent with orthostasis. CAD/PCI to RCA 2018, denies chest pain.  Continue aspirin, Lipitor 40 mg daily.  Echo with preserved EF, 60 to 65%. Hypertension, BP controlled at home.  Continue to hold beta-blockers, monitor off BP meds. Current smoker, cessation again advised.  Follow-up  in 1 year.    Medication Adjustments/Labs and Tests Ordered: Current medicines are reviewed at length with the patient today.  Concerns regarding medicines are outlined above.  No orders of the defined types were placed in this encounter.   No orders of the defined types were placed in this encounter.    Patient Instructions  Medication Instructions:   Your physician recommends that you continue on your current medications as directed. Please refer to the Current Medication list given to you today.  *If you need a refill on your cardiac medications before your next appointment, please call your pharmacy*   Follow-Up: At [facility], you and your health needs are our priority.  As part of our continuing mission to provide you with exceptional heart care, we have created designated Provider Care Teams.  These Care Teams include your primary Cardiologist (physician) and Advanced Practice Providers (APPs -  Physician Assistants and Nurse Practitioners) who all work together to provide you with the care you need, when you need it.  We recommend signing up for the patient portal called "MyChart".  Sign up information is provided on this After Visit Summary.  MyChart is used to connect with patients for Virtual Visits (Telemedicine).  Patients are able to view lab/test results, encounter  notes, upcoming appointments, etc.  Non-urgent messages can be sent to your provider as well.   To learn more about what you can do with MyChart, go to [email or web address].    Your next appointment:   1 year(s)  The format for your next appointment:   In Person  Provider:   Kate Sable, MD     Important Information About Sugar         Signed, Kate Sable, MD  05/04/2022 9:09 AM    [place]

## 2022-05-06 NOTE — Addendum Note (Signed)
Addended by: Britt Bottom on: 05/06/2022 10:27 AM   Modules accepted: Orders

## 2022-06-11 ENCOUNTER — Other Ambulatory Visit: Payer: Self-pay

## 2022-06-14 ENCOUNTER — Ambulatory Visit: Payer: Federal, State, Local not specified - PPO | Admitting: Gastroenterology

## 2022-07-13 DIAGNOSIS — I10 Essential (primary) hypertension: Secondary | ICD-10-CM | POA: Diagnosis not present

## 2022-07-13 DIAGNOSIS — G8929 Other chronic pain: Secondary | ICD-10-CM | POA: Diagnosis not present

## 2022-07-13 DIAGNOSIS — M25511 Pain in right shoulder: Secondary | ICD-10-CM | POA: Diagnosis not present

## 2022-07-13 DIAGNOSIS — R42 Dizziness and giddiness: Secondary | ICD-10-CM | POA: Diagnosis not present

## 2022-08-26 ENCOUNTER — Other Ambulatory Visit: Payer: Self-pay | Admitting: Gastroenterology

## 2022-09-08 DIAGNOSIS — K047 Periapical abscess without sinus: Secondary | ICD-10-CM | POA: Diagnosis not present

## 2022-10-19 ENCOUNTER — Other Ambulatory Visit: Payer: Self-pay | Admitting: Gastroenterology

## 2022-10-19 ENCOUNTER — Ambulatory Visit: Payer: Federal, State, Local not specified - PPO | Admitting: Gastroenterology

## 2022-10-19 VITALS — BP 156/87 | HR 54 | Temp 98.1°F | Ht 70.0 in | Wt 153.6 lb

## 2022-10-19 DIAGNOSIS — K8681 Exocrine pancreatic insufficiency: Secondary | ICD-10-CM

## 2022-10-19 NOTE — Progress Notes (Signed)
Cephas Darby, MD [address]  [place]  [place],  [address]  Main: [phone]  Fax: [phone]    Gastroenterology Consultation  Referring Provider:     Marygrace Drought, MD Primary Care Physician:  Gabriel Bolk, FNP Primary Gastroenterologist:  Dr. Cephas Darby Reason for Consultation: Exocrine pancreatic insufficiency        HPI:   Gabriel Grant is a 62 y.o. male referred by Gabriel Bolk, FNP  for consultation & management of chronic epigastric pain, postprandial diarrhea.  Patient reports approximately 3 months history of constant pain in the epigastric region associated with postprandial urgency and diarrhea about 3-4 times daily.  He denies any rectal bleeding.  He does report some abdominal bloating.  He reports worsening of symptoms with fried foods, fatty foods.  He does have history of 40+ years of smoking tobacco.  He does have history of coronary disease, s/p PCI, previously on Brilinta which has been discontinued per his cardiology recommendations, currently on aspirin 81 mg daily.  He had history of cholecystectomy in 01/2019 at [facility] for epigastric pain, symptomatic cholelithiasis.  He reports that the pain was not relieved after cholecystectomy.  He does report gradual weight loss as well.  He was recently in the ER secondary to chest pain as well as epigastric pain, he thought he had another heart attack, troponins were negative, he underwent CT abdomen and pelvis which revealed normal pancreas, does have colonic diverticulosis.  Most recent labs revealed mild leukocytosis, hemoglobin 14.2, normal platelets, normal LFTs, normal lipase  He denies alcohol use  Follow-up visit 03/03/2021: Patient is diagnosed with exocrine pancreatic insufficiency, his pancreatic fecal elastase levels are 152.  I started him on Creon 72,000 units with each meal and 32,000 units with snack.  Patient continues to lose weight.  However, he did notice good improvement  in his diarrhea, bloating and abdominal cramps.  He did notice worsening of his symptoms next day morning when he skipped taking Creon for dinner.  He continues to smoke cigarettes.  He manages to eat only 2 meals a day and maybe 1 snack a day due to his work schedule  Follow-up visit 06/09/2021 Patient reports that he is weight loss has more or less stabilized.  His diarrhea has resolved.  He describes his bowel movements as 4 out of 5 and Bristol stool scale.  However, he reports ongoing epigastric pain and is interested to undergo endoscopic evaluation at this time.  He continues to have only 2 meals a day and 1 snack.  Follow-up visit 10/19/2022 Patient is here for follow-up of exocrine pancreatic insufficiency.  He reports that overall he has been doing well.  His weight has been stable.  Reports very less amount of diarrhea and bloating.  He underwent upper endoscopy and colonoscopy which revealed short segment Barrett's esophagus with no evidence of dysplasia and there was no evidence of microscopic colitis or inflammatory bowel disease or celiac disease.  Patient does not have any major concerns today   NSAIDs: None  Antiplts/Anticoagulants/Anti thrombotics: Aspirin 81 mg  GI Procedures:  EGD and colonoscopy 06/23/2021 for chronic diarrhea due to residual symptoms.  Normal duodenal bulb and second portion of duodenum   DIAGNOSIS: A.  DUODENUM; COLD BIOPSY: - ENTERIC MUCOSA WITH PRESERVED VILLOUS ARCHITECTURE AND NO SIGNIFICANT HISTOPATHOLOGIC CHANGE. - NEGATIVE FOR FEATURES OF CELIAC, DYSPLASIA, AND MALIGNANCY.  B. STOMACH, ANTRUM; COLD BIOPSY: - GASTRIC ANTRAL AND OXYNTIC MUCOSA WITH CHANGES SUGGESTIVE OF  HEALING MUCOSAL INJURY. - NEGATIVE FOR H. PYLORI, DYSPLASIA, AND MALIGNANCY.  C. STOMACH, BODY; COLD BIOPSY: - GASTRIC OXYNTIC MUCOSA WITH MINIMAL PARIETAL CELL HYPERPLASIA, SUGGESTIVE OF MILD PPI EFFECT. - NEGATIVE FOR H. PYLORI, DYSPLASIA, AND MALIGNANCY.  Comment: The  endoscopic impression of diffusely nodular mucosa is noted. Multiple additional deeper recut levels were examined.  D. GASTROESOPHAGEAL JUNCTION; COLD BIOPSY: - SQUAMOCOLUMNAR MUCOSA WITH INTESTINAL METAPLASIA AND FEATURES OF REFLUX GASTROESOPHAGITIS. - NEGATIVE FOR DYSPLASIA AND MALIGNANCY.  E. COLON, RANDOM; COLD BIOPSY: - BENIGN COLONIC MUCOSA WITH NO SIGNIFICANT HISTOPATHOLOGIC CHANGE. - NEGATIVE FOR FEATURES OF MICROSCOPIC COLITIS. - NEGATIVE FOR DYSPLASIA AND MALIGNANCY.  F. COLON POLYPS X2, RECTOSIGMOID; COLD SNARE: - MULTIPLE FRAGMENTS OF TUBULAR ADENOMAS. - INKED POLYP BASES APPEAR FREE OF DYSPLASIA. - NEGATIVE FOR HIGH-GRADE DYSPLASIA AND MALIGNANCY.    Cologuard negative in 2021 He denies family history of pancreatic cancer, GI malignancy  Past Medical History:  Diagnosis Date   CAD (coronary artery disease)    Hyperlipidemia    Hypertension     Past Surgical History:  Procedure Laterality Date   CARDIAC CATHETERIZATION     COLONOSCOPY WITH PROPOFOL N/A 06/23/2021   Procedure: COLONOSCOPY WITH PROPOFOL;  Surgeon: Lin Landsman, MD;  Location: [facility] ENDOSCOPY;  Service: Gastroenterology;  Laterality: N/A;   CORONARY STENT INTERVENTION Right 11/15/2016   Procedure: Coronary Angiogram;  Surgeon: Dionisio David, MD;  Location: [place] [facility];  Service: Cardiovascular;  Laterality: Right;   CORONARY STENT INTERVENTION N/A 11/15/2016   Procedure: Coronary Stent Intervention;  Surgeon: Yolonda Kida, MD;  Location: [place] [facility];  Service: Cardiovascular;  Laterality: N/A;   ESOPHAGOGASTRODUODENOSCOPY N/A 06/23/2021   Procedure: ESOPHAGOGASTRODUODENOSCOPY (EGD);  Surgeon: Lin Landsman, MD;  Location: [facility] ENDOSCOPY;  Service: Gastroenterology;  Laterality: N/A;   LEFT HEART CATH AND CORONARY ANGIOGRAPHY Right 11/15/2016   Procedure: Left Heart Cath;  Surgeon: Dionisio David, MD;  Location: [place] [facility];  Service: Cardiovascular;   Laterality: Right;   TONSILLECTOMY      Current Outpatient Medications:    aspirin EC 81 MG EC tablet, Take 1 tablet (81 mg total) by mouth daily., Disp: 30 tablet, Rfl: 0   atorvastatin (LIPITOR) 40 MG tablet, Take 1 tablet by mouth daily., Disp: , Rfl:    diphenhydramine-acetaminophen (TYLENOL PM) 25-500 MG TABS tablet, Take 1 tablet by mouth at bedtime., Disp: , Rfl:    loratadine (CLARITIN) 10 MG tablet, Take 10 mg by mouth daily., Disp: , Rfl:    NAPROXEN PO, Take by mouth as needed., Disp: , Rfl:    nicotine polacrilex (COMMIT) 2 MG lozenge, Place inside cheek., Disp: , Rfl:    nitroGLYCERIN (NITROSTAT) 0.4 MG SL tablet, Place 1 tablet (0.4 mg total) under the tongue every 5 (five) minutes as needed for chest pain., Disp: 30 tablet, Rfl: 0   pantoprazole (PROTONIX) 40 MG tablet, Take 1 tablet by mouth daily., Disp: , Rfl:    lipase/protease/amylase (CREON) 36000 UNITS CPEP capsule, TAKE 2 CAPSULES BY MOUTH WITH FIRST BITE OF EACH MEAL AND 1 CAPSULE WITH FIRST BITE OF EACH SNACK, Disp: 240 capsule, Rfl: 12   sildenafil (REVATIO) 20 MG tablet, Take 20 mg by mouth every morning. (Patient not taking: Reported on 10/19/2022), Disp: , Rfl:    Family History  Problem Relation Age of Onset   Cancer Mother        breast   Hyperlipidemia Mother    Hypertension Father    CAD Father  CAD Paternal Grandfather      Social History   Tobacco Use   Smoking status: Every Day    Packs/day: 0.50    Types: Cigarettes   Smokeless tobacco: Never   Tobacco comments:    trying to quit  Vaping Use   Vaping Use: Never used  Substance Use Topics   Alcohol use: No   Drug use: No    Allergies as of 10/19/2022 - Review Complete 10/19/2022  Allergen Reaction Noted   Codeine Other (See Comments) 02/28/2016   Sulfa antibiotics Rash 04/15/2020    Review of Systems:    All systems reviewed and negative except where noted in HPI.   Physical Exam:  BP (!) 156/87 (BP Location: Left Arm, Patient  Position: Sitting, Cuff Size: Normal)   Pulse (!) 54   Temp 98.1 F (36.7 C) (Oral)   Ht 5' 10"$  (1.778 m)   Wt 153 lb 9.6 oz (69.7 kg)   BMI 22.04 kg/m  No LMP for male patient.  General:   Alert, thin built, moderately nourished, pleasant and cooperative in NAD Head:  Normocephalic and atraumatic. Eyes:  Sclera clear, no icterus.   Conjunctiva pink. Ears:  Normal auditory acuity. Nose:  No deformity, discharge, or lesions. Mouth:  No deformity or lesions,oropharynx pink & moist. Neck:  Supple; no masses or thyromegaly. Lungs:  Respirations even and unlabored.  Clear throughout to auscultation.   No wheezes, crackles, or rhonchi. No acute distress. Heart:  Regular rate and rhythm; no murmurs, clicks, rubs, or gallops. Abdomen:  Normal bowel sounds. Soft, nontender, nondistended without masses, hepatosplenomegaly or hernias noted.  No guarding or rebound tenderness.   Rectal: Not performed Msk:  Symmetrical without gross deformities. Good, equal movement & strength bilaterally. Pulses:  Normal pulses noted. Extremities:  No clubbing or edema.  No cyanosis. Neurologic:  Alert and oriented x3;  grossly normal neurologically. Skin:  Intact without significant lesions or rashes. No jaundice. Psych:  Alert and cooperative. Normal mood and affect.  Imaging Studies: Reviewed  Assessment and Plan:   Gabriel Grant is a 62 y.o. male with history of cholecystectomy at [facility] on 01/29/2019, coronary artery disease s/p PCI and stent placement, previously on Brilinta, currently maintained on aspirin 81 mg daily, 40+ years of smoking tobacco is seen in consultation for chronic epigastric pain associated with postprandial urgency and abdominal bloating, gradual weight loss.  His symptoms are secondary to chronic pancreatitis resulting in exocrine pancreatic insufficiency.    Exocrine pancreatic insufficiency based on pancreatic fecal elastase levels: Diarrhea has resolved, epigastric pain has  improved, CT abdomen and pelvis on 11/04/2020 did not reveal any evidence of pancreatic lesions or chronic pancreatitis.   EGD and colonoscopy were unremarkable H. pylori stool antigen negative Continue Creon to 36,000 units 2-3 capsules with each meal and 2 with snack  Reiterated on high-protein diet  Discussed about smoking cessation Advised to take vitamin D supplements as well as multivitamin with minerals daily  Tubular adenomas of the colon Previous colonoscopy in 06/2021 was fair prep Recommend repeat colonoscopy with 2-day prep   Follow up annually   Cephas Darby, MD

## 2022-10-19 NOTE — Telephone Encounter (Signed)
Patient was at the office for an appointment today and stated that he needed a refill on his prescription of creon.

## 2022-10-20 MED ORDER — PANCRELIPASE (LIP-PROT-AMYL) 36000-114000 UNITS PO CPEP: ORAL_CAPSULE | ORAL | 12 refills | Status: AC

## 2022-10-20 NOTE — Addendum Note (Signed)
Addended by: Ulyess Blossom L on: 10/20/2022 07:37 AM   Modules accepted: Orders

## 2022-10-20 NOTE — Telephone Encounter (Signed)
Was seen 10/19/2022

## 2022-10-23 ENCOUNTER — Encounter: Payer: Self-pay | Admitting: Gastroenterology

## 2022-10-25 ENCOUNTER — Other Ambulatory Visit: Payer: Self-pay

## 2022-10-25 ENCOUNTER — Telehealth: Payer: Self-pay

## 2022-10-25 DIAGNOSIS — Z86010 Personal history of colonic polyps: Secondary | ICD-10-CM

## 2022-10-25 MED ORDER — GOLYTELY 236 G PO SOLR: 4000.0000 mL | Freq: Once | ORAL | 0 refills | Status: AC

## 2022-10-25 NOTE — Telephone Encounter (Signed)
Pt returning call would like a return call -[phone]

## 2022-10-25 NOTE — Telephone Encounter (Signed)
-----   Message from Lin Landsman, MD sent at 10/23/2022  3:44 PM EST ----- Regarding: Schedule colonoscopy Caryl Pina  This patient is past due for his colonoscopy for history of colon polyps and his previous colonoscopy in 10/22 was fair prep I have advised repeat colonoscopy in 6 months with 2-day prep  Please call and schedule if patient is agreeable  Rohini Vanga

## 2022-10-25 NOTE — Telephone Encounter (Signed)
Called and got patient schedule for 11/16/2022 with Dr. Marius Ditch. Went over instructions, mailed them and sent to [employer]. Sent prep to the pharmacy

## 2022-10-25 NOTE — Telephone Encounter (Signed)
Called and left a message for call back  

## 2022-10-26 ENCOUNTER — Telehealth: Payer: Self-pay

## 2022-10-26 NOTE — Telephone Encounter (Signed)
Patient called and states he can not be off work on 11/16/2022. He asked if we could reschedule procedure to 11/30/2022. Called and informed trish and sent out new instructions

## 2022-11-29 ENCOUNTER — Encounter: Payer: Self-pay | Admitting: Gastroenterology

## 2022-11-30 ENCOUNTER — Ambulatory Visit: Payer: Federal, State, Local not specified - PPO | Admitting: Anesthesiology

## 2022-11-30 ENCOUNTER — Encounter: Payer: Self-pay | Admitting: Gastroenterology

## 2022-11-30 ENCOUNTER — Encounter
Admission: RE | Disposition: A | Discharge status: Home / Self Care | Payer: Self-pay | Source: Home / Self Care | Attending: Gastroenterology

## 2022-11-30 ENCOUNTER — Other Ambulatory Visit: Payer: Self-pay

## 2022-11-30 ENCOUNTER — Ambulatory Visit
Admission: RE | Admit: 2022-11-30 | Discharge: 2022-11-30 | Disposition: A | Discharge status: Home / Self Care | Payer: Federal, State, Local not specified - PPO | Attending: Gastroenterology | Admitting: Gastroenterology

## 2022-11-30 DIAGNOSIS — F17210 Nicotine dependence, cigarettes, uncomplicated: Secondary | ICD-10-CM | POA: Diagnosis not present

## 2022-11-30 DIAGNOSIS — I10 Essential (primary) hypertension: Secondary | ICD-10-CM | POA: Insufficient documentation

## 2022-11-30 DIAGNOSIS — Z955 Presence of coronary angioplasty implant and graft: Secondary | ICD-10-CM | POA: Diagnosis not present

## 2022-11-30 DIAGNOSIS — K219 Gastro-esophageal reflux disease without esophagitis: Secondary | ICD-10-CM | POA: Diagnosis not present

## 2022-11-30 DIAGNOSIS — Z860100 Personal history of colon polyps, unspecified: Secondary | ICD-10-CM

## 2022-11-30 DIAGNOSIS — E785 Hyperlipidemia, unspecified: Secondary | ICD-10-CM | POA: Insufficient documentation

## 2022-11-30 DIAGNOSIS — I252 Old myocardial infarction: Secondary | ICD-10-CM | POA: Diagnosis not present

## 2022-11-30 DIAGNOSIS — Z1211 Encounter for screening for malignant neoplasm of colon: Secondary | ICD-10-CM | POA: Diagnosis present

## 2022-11-30 DIAGNOSIS — K635 Polyp of colon: Secondary | ICD-10-CM | POA: Insufficient documentation

## 2022-11-30 DIAGNOSIS — I2510 Atherosclerotic heart disease of native coronary artery without angina pectoris: Secondary | ICD-10-CM | POA: Insufficient documentation

## 2022-11-30 HISTORY — PX: COLONOSCOPY WITH PROPOFOL: SHX5780

## 2022-11-30 SURGERY — COLONOSCOPY WITH PROPOFOL
Anesthesia: General

## 2022-11-30 MED ORDER — DEXMEDETOMIDINE HCL IN NACL 80 MCG/20ML IV SOLN
INTRAVENOUS | Status: AC
  Filled 2022-11-30: qty 20

## 2022-11-30 MED ORDER — PROPOFOL 500 MG/50ML IV EMUL
INTRAVENOUS | Status: DC | PRN
  Administered 2022-11-30: 140 ug/kg/min via INTRAVENOUS

## 2022-11-30 MED ORDER — LIDOCAINE HCL (CARDIAC) PF 100 MG/5ML IV SOSY
PREFILLED_SYRINGE | INTRAVENOUS | Status: DC | PRN
  Administered 2022-11-30: 80 mg via INTRAVENOUS

## 2022-11-30 MED ORDER — PHENYLEPHRINE 80 MCG/ML (10ML) SYRINGE FOR IV PUSH (FOR BLOOD PRESSURE SUPPORT)
PREFILLED_SYRINGE | INTRAVENOUS | Status: AC
  Filled 2022-11-30: qty 10

## 2022-11-30 MED ORDER — STERILE WATER FOR IRRIGATION IR SOLN
Status: DC | PRN
  Administered 2022-11-30: 60 mL

## 2022-11-30 MED ORDER — SODIUM CHLORIDE 0.9 % IV SOLN: INTRAVENOUS | Status: DC

## 2022-11-30 MED ORDER — PROPOFOL 10 MG/ML IV BOLUS
INTRAVENOUS | Status: DC | PRN
  Administered 2022-11-30: 70 mg via INTRAVENOUS

## 2022-11-30 MED ORDER — LIDOCAINE HCL (PF) 2 % IJ SOLN
INTRAMUSCULAR | Status: AC
  Filled 2022-11-30: qty 5

## 2022-11-30 MED ORDER — EPHEDRINE SULFATE (PRESSORS) 50 MG/ML IJ SOLN
INTRAMUSCULAR | Status: DC | PRN
  Administered 2022-11-30: 10 mg via INTRAVENOUS

## 2022-11-30 MED ORDER — EPHEDRINE 5 MG/ML INJ
INTRAVENOUS | Status: AC
  Filled 2022-11-30: qty 5

## 2022-11-30 MED ORDER — PROPOFOL 10 MG/ML IV BOLUS
INTRAVENOUS | Status: AC
  Filled 2022-11-30: qty 20

## 2022-11-30 NOTE — Anesthesia Preprocedure Evaluation (Addendum)
Anesthesia Evaluation  Patient identified by MRN, date of birth, ID band Patient awake    Reviewed: Allergy & Precautions, NPO status , Patient's Chart, lab work & pertinent test results, reviewed documented beta blocker date and time   Airway Mallampati: II  TM Distance: >3 FB Neck ROM: Full    Dental  (+) Poor Dentition, Missing,    Pulmonary Current Smoker and Patient abstained from smoking.   Pulmonary exam normal        Cardiovascular hypertension, Pt. on medications and Pt. on home beta blockers + CAD, + Past MI and + Cardiac Stents  Normal cardiovascular exam     Neuro/Psych negative neurological ROS  negative psych ROS   GI/Hepatic Neg liver ROS, Bowel prep,GERD  Medicated,,  Endo/Other  negative endocrine ROS    Renal/GU negative Renal ROS  negative genitourinary   Musculoskeletal negative musculoskeletal ROS (+)    Abdominal Normal abdominal exam  (+)   Peds negative pediatric ROS (+)  Hematology negative hematology ROS (+)   Anesthesia Other Findings CAD (coronary artery disease)  Hyperlipidemia    Hypertension       Reproductive/Obstetrics negative OB ROS                             Anesthesia Physical Anesthesia Plan  ASA: 3  Anesthesia Plan: General   Post-op Pain Management:    Induction: Intravenous  PONV Risk Score and Plan: 2 and TIVA and Propofol infusion  Airway Management Planned: Natural Airway and Nasal Cannula  Additional Equipment:   Intra-op Plan:   Post-operative Plan: Extubation in OR  Informed Consent: I have reviewed the patients History and Physical, chart, labs and discussed the procedure including the risks, benefits and alternatives for the proposed anesthesia with the patient or authorized representative who has indicated his/her understanding and acceptance.     Dental advisory given  Plan Discussed with: CRNA, Anesthesiologist and  Surgeon  Anesthesia Plan Comments:         Anesthesia Quick Evaluation

## 2022-11-30 NOTE — H&P (Signed)
Cephas Darby, MD [address]  [place]  [place], [place] [address]  Main: [phone]  Fax: [phone] Pager: [phone]  Primary Care Physician:  Romualdo Bolk, FNP Primary Gastroenterologist:  Dr. Cephas Darby  Pre-Procedure History & Physical: HPI:  Gabriel Grant is a 62 y.o. male is here for an colonoscopy.   Past Medical History:  Diagnosis Date   Acute MI ([place]) 11/14/2016   CAD (coronary artery disease)    Hyperlipidemia    Hypertension     Past Surgical History:  Procedure Laterality Date   CARDIAC CATHETERIZATION     CHOLECYSTECTOMY  2020   COLONOSCOPY WITH PROPOFOL N/A 06/23/2021   Procedure: COLONOSCOPY WITH PROPOFOL;  Surgeon: Lin Landsman, MD;  Location: [facility] ENDOSCOPY;  Service: Gastroenterology;  Laterality: N/A;   CORONARY STENT INTERVENTION Right 11/15/2016   Procedure: Coronary Angiogram;  Surgeon: Dionisio David, MD;  Location: [place] CV LAB;  Service: Cardiovascular;  Laterality: Right;   CORONARY STENT INTERVENTION N/A 11/15/2016   Procedure: Coronary Stent Intervention;  Surgeon: Yolonda Kida, MD;  Location: [place] [facility];  Service: Cardiovascular;  Laterality: N/A;   ESOPHAGOGASTRODUODENOSCOPY N/A 06/23/2021   Procedure: ESOPHAGOGASTRODUODENOSCOPY (EGD);  Surgeon: Lin Landsman, MD;  Location: [facility] ENDOSCOPY;  Service: Gastroenterology;  Laterality: N/A;   LEFT HEART CATH AND CORONARY ANGIOGRAPHY Right 11/15/2016   Procedure: Left Heart Cath;  Surgeon: Dionisio David, MD;  Location: [place] [facility];  Service: Cardiovascular;  Laterality: Right;   TONSILLECTOMY      Prior to Admission medications   Medication Sig Start Date End Date Taking? Authorizing Provider  aspirin EC 81 MG EC tablet Take 1 tablet (81 mg total) by mouth daily. 11/16/16  Yes Wieting, Richard, MD  atorvastatin (LIPITOR) 40 MG tablet Take 1 tablet by mouth daily. 04/13/22 04/14/23 Yes [provider]   diphenhydramine-acetaminophen (TYLENOL PM) 25-500 MG TABS tablet Take 1 tablet by mouth at bedtime.   Yes [provider]  lipase/protease/amylase (CREON) 36000 UNITS CPEP capsule TAKE 2 CAPSULES BY MOUTH WITH FIRST BITE OF [facility] MEAL AND 1 CAPSULE WITH FIRST BITE OF EACH SNACK 10/20/22  Yes Valecia Beske, Tally Due, MD  loratadine (CLARITIN) 10 MG tablet Take 10 mg by mouth daily.   Yes [provider]  NAPROXEN PO Take by mouth as needed.   Yes [provider]  pantoprazole (PROTONIX) 40 MG tablet Take 1 tablet by mouth daily. 04/13/22  Yes [provider]  nicotine polacrilex (COMMIT) 2 MG lozenge Place inside cheek. 04/27/18   [provider]  nitroGLYCERIN (NITROSTAT) 0.4 MG SL tablet Place 1 tablet (0.4 mg total) under the tongue every 5 (five) minutes as needed for chest pain. 11/16/16   [name], MD  sildenafil (REVATIO) 20 MG tablet Take 20 mg by mouth every morning. Patient not taking: Reported on 10/19/2022 07/27/20   [provider]    Allergies as of 10/26/2022 - Review Complete 10/19/2022  Allergen Reaction Noted   Codeine Other (See Comments) 02/28/2016   Sulfa antibiotics Rash 04/15/2020    Family History  Problem Relation Age of Onset   Cancer Mother        breast   Hyperlipidemia Mother    Hypertension Father    CAD Father    CAD Paternal Grandfather     Social History   Socioeconomic History   Marital status: Married    Spouse name: Not on file   Number of children: Not  on file   Years of education: Not on file   Highest education level: Not on file  Occupational History   Not on file  Tobacco Use   Smoking status: Every Day    Packs/day: .5    Types: Cigarettes   Smokeless tobacco: Never   Tobacco comments:    trying to quit  Vaping Use   Vaping Use: Never used  Substance and Sexual Activity   Alcohol use: No   Drug use: No   Sexual activity: Not on file  Other Topics Concern   Not on file  Social  History Narrative   Not on file   Social Determinants of Health   Financial Resource Strain: Not on file  Food Insecurity: Not on file  Transportation Needs: Not on file  Physical Activity: Not on file  Stress: Not on file  Social Connections: Not on file  Intimate Partner Violence: Not on file    Review of Systems: See HPI, otherwise negative ROS  Physical Exam: BP 119/78   Pulse (!) 55   Temp 97.6 F (36.4 C) (Temporal)   Resp 16   Ht 5\' 10"  (1.778 m)   Wt 68 kg   SpO2 100%   BMI 21.52 kg/m  General:   Alert,  pleasant and cooperative in NAD Head:  Normocephalic and atraumatic. Neck:  Supple; no masses or thyromegaly. Lungs:  Clear throughout to auscultation.    Heart:  Regular rate and rhythm. Abdomen:  Soft, nontender and nondistended. Normal bowel sounds, without guarding, and without rebound.   Neurologic:  Alert and  oriented x4;  grossly normal neurologically.  Impression/Plan: [name] is here for an colonoscopy to be performed for h/o colon adenomas  Risks, benefits, limitations, and alternatives regarding  colonoscopy have been reviewed with the patient.  Questions have been answered.  All parties agreeable.   Sherri Sear, MD  11/30/2022, 7:43 AM

## 2022-11-30 NOTE — Anesthesia Postprocedure Evaluation (Signed)
Anesthesia Post Note  Patient: Gabriel Grant  Procedure(s) Performed: COLONOSCOPY WITH PROPOFOL  Patient location during evaluation: PACU Anesthesia Type: General Level of consciousness: awake and alert Pain management: pain level controlled Vital Signs Assessment: post-procedure vital signs reviewed and stable Respiratory status: spontaneous breathing, nonlabored ventilation and respiratory function stable Cardiovascular status: blood pressure returned to baseline and stable Postop Assessment: no apparent nausea or vomiting Anesthetic complications: no   No notable events documented.   Last Vitals:  Vitals:   11/30/22 0813 11/30/22 0833  BP: 129/66 (!) 161/82  Pulse: 87   Resp: 18   Temp: (!) 36.4 C   SpO2: 100%     Last Pain:  Vitals:   11/30/22 0833  TempSrc:   PainSc: 0-No pain                 [place] Ouch

## 2022-11-30 NOTE — Transfer of Care (Signed)
Immediate Anesthesia Transfer of Care Note  Patient: Gabriel Grant  Procedure(s) Performed: COLONOSCOPY WITH PROPOFOL  Patient Location: PACU  Anesthesia Type:General  Level of Consciousness: awake, alert , and oriented  Airway & Oxygen Therapy: Patient Spontanous Breathing  Post-op Assessment: Report given to RN and Post -op Vital signs reviewed and stable  Post vital signs: Reviewed and stable  Last Vitals:  Vitals Value Taken Time  BP 129/66 11/30/22 0813  Temp 36.4 C 11/30/22 0813  Pulse 56 11/30/22 0813  Resp 13 11/30/22 0813  SpO2 100 % 11/30/22 0813  Vitals shown include unvalidated device data.  Last Pain:  Vitals:   11/30/22 0813  TempSrc: Temporal  PainSc: Asleep         Complications: No notable events documented.

## 2022-11-30 NOTE — Op Note (Signed)
[facility] Gastroenterology Patient Name: Gabriel Grant Procedure Date: 11/30/2022 7:31 AM MRN: [id number] Account #: [id number] Date of Birth: 08/23/1961 Admit Type: Outpatient Age: 62 Room: [facility] ENDO ROOM 3 Gender: Male Note Status: Finalized Instrument Name: Jasper Riling [id number] Procedure:             Colonoscopy Indications:           Surveillance: History of adenomatous polyps,                         inadequate prep on last exam (<40yr), Last colonoscopy:                         October 2022 Providers:             Lin Landsman MD, MD Referring MD:          Wonda Cheng. Gale Journey (Referring MD) Medicines:             General Anesthesia Complications:         No immediate complications. Estimated blood loss: None. Procedure:             Pre-Anesthesia Assessment:                        - Prior to the procedure, a History and Physical was                         performed, and patient medications and allergies were                         reviewed. The patient is competent. The risks and                         benefits of the procedure and the sedation options and                         risks were discussed with the patient. All questions                         were answered and informed consent was obtained.                         Patient identification and proposed procedure were                         verified by the physician, the nurse, the                         anesthesiologist, the anesthetist and the technician                         in the pre-procedure area in the procedure room in the                         endoscopy suite. Mental Status Examination: alert and                         oriented. Airway Examination: normal oropharyngeal  airway and neck mobility. Respiratory Examination:                         clear to auscultation. CV Examination: normal.                         Prophylactic Antibiotics: The patient does  not require                         prophylactic antibiotics. Prior Anticoagulants: The                         patient has taken no anticoagulant or antiplatelet                         agents. ASA Grade Assessment: III - A patient with                         severe systemic disease. After reviewing the risks and                         benefits, the patient was deemed in satisfactory                         condition to undergo the procedure. The anesthesia                         plan was to use general anesthesia. Immediately prior                         to administration of medications, the patient was                         re-assessed for adequacy to receive sedatives. The                         heart rate, respiratory rate, oxygen saturations,                         blood pressure, adequacy of pulmonary ventilation, and                         response to care were monitored throughout the                         procedure. The physical status of the patient was                         re-assessed after the procedure.                        After obtaining informed consent, the colonoscope was                         passed under direct vision. Throughout the procedure,                         the patient's blood pressure, pulse, and oxygen  saturations were monitored continuously. The                         Colonoscope was introduced through the anus and                         advanced to the the cecum, identified by appendiceal                         orifice and ileocecal valve. The colonoscopy was                         performed without difficulty. The patient tolerated                         the procedure well. The quality of the bowel                         preparation was evaluated using the BBPS [facility] Bowel                         Preparation Scale) with scores of: Right Colon = 3,                         Transverse Colon = 3 and Left  Colon = 3 (entire mucosa                         seen well with no residual staining, small fragments                         of stool or opaque liquid). The total BBPS score                         equals 9. The ileocecal valve, appendiceal orifice,                         and rectum were photographed. Findings:      The perianal and digital rectal examinations were normal. Pertinent       negatives include normal sphincter tone and no palpable rectal lesions.      A 3 mm polyp was found in the sigmoid colon. The polyp was sessile. The       polyp was removed with a cold snare. Resection and retrieval were       complete. Estimated blood loss: none.      The retroflexed view of the distal rectum and anal verge was normal and       showed no anal or rectal abnormalities. Impression:            - One 3 mm polyp in the sigmoid colon, removed with a                         cold snare. Resected and retrieved.                        - The distal rectum and anal verge are normal on  retroflexion view. Recommendation:        - Discharge patient to home (with escort).                        - Resume previous diet today.                        - Continue present medications.                        - Await pathology results.                        - Repeat colonoscopy in 7-10 years for surveillance                         based on pathology results. Procedure Code(s):     --- Professional ---                        [phone], Colonoscopy, flexible; with removal of                         tumor(s), polyp(s), or other lesion(s) by snare                         technique Diagnosis Code(s):     --- Professional ---                        Z86.010, Personal history of colonic polyps                        D12.5, Benign neoplasm of sigmoid colon CPT copyright 2022 American Medical Association. All rights reserved. The codes documented in this report are preliminary and upon coder  review may  be revised to meet current compliance requirements. Dr. Ulyess Mort Lin Landsman MD, MD 11/30/2022 8:12:14 AM This report has been signed electronically. Number of Addenda: 0 Note Initiated On: 11/30/2022 7:31 AM Scope Withdrawal Time: 0 hours 11 minutes 10 seconds  Total Procedure Duration: 0 hours 15 minutes 52 seconds  Estimated Blood Loss:  Estimated blood loss: none.      [facility]

## 2022-12-01 ENCOUNTER — Encounter: Payer: Self-pay | Admitting: Gastroenterology

## 2022-12-01 LAB — SURGICAL PATHOLOGY

## 2023-01-13 ENCOUNTER — Emergency Department: Payer: Federal, State, Local not specified - PPO

## 2023-01-13 ENCOUNTER — Encounter: Payer: Self-pay | Admitting: *Deleted

## 2023-01-13 ENCOUNTER — Inpatient Hospital Stay
Admission: EM | Admit: 2023-01-13 | Discharge: 2023-01-18 | DRG: 158 | Disposition: A | Discharge status: Home Health | Payer: Federal, State, Local not specified - PPO | Attending: Internal Medicine | Admitting: Internal Medicine

## 2023-01-13 ENCOUNTER — Other Ambulatory Visit: Payer: Self-pay

## 2023-01-13 DIAGNOSIS — Z79899 Other long term (current) drug therapy: Secondary | ICD-10-CM

## 2023-01-13 DIAGNOSIS — Z83438 Family history of other disorder of lipoprotein metabolism and other lipidemia: Secondary | ICD-10-CM | POA: Diagnosis not present

## 2023-01-13 DIAGNOSIS — Z885 Allergy status to narcotic agent status: Secondary | ICD-10-CM

## 2023-01-13 DIAGNOSIS — F17210 Nicotine dependence, cigarettes, uncomplicated: Secondary | ICD-10-CM | POA: Diagnosis present

## 2023-01-13 DIAGNOSIS — Z7982 Long term (current) use of aspirin: Secondary | ICD-10-CM

## 2023-01-13 DIAGNOSIS — E440 Moderate protein-calorie malnutrition: Secondary | ICD-10-CM | POA: Diagnosis present

## 2023-01-13 DIAGNOSIS — Z955 Presence of coronary angioplasty implant and graft: Secondary | ICD-10-CM | POA: Diagnosis not present

## 2023-01-13 DIAGNOSIS — Z6821 Body mass index (BMI) 21.0-21.9, adult: Secondary | ICD-10-CM

## 2023-01-13 DIAGNOSIS — L0390 Cellulitis, unspecified: Secondary | ICD-10-CM | POA: Insufficient documentation

## 2023-01-13 DIAGNOSIS — I2510 Atherosclerotic heart disease of native coronary artery without angina pectoris: Secondary | ICD-10-CM | POA: Diagnosis present

## 2023-01-13 DIAGNOSIS — L03211 Cellulitis of face: Secondary | ICD-10-CM | POA: Diagnosis present

## 2023-01-13 DIAGNOSIS — E785 Hyperlipidemia, unspecified: Secondary | ICD-10-CM | POA: Diagnosis present

## 2023-01-13 DIAGNOSIS — L03811 Cellulitis of head [any part, except face]: Secondary | ICD-10-CM | POA: Diagnosis not present

## 2023-01-13 DIAGNOSIS — Z882 Allergy status to sulfonamides status: Secondary | ICD-10-CM

## 2023-01-13 DIAGNOSIS — M272 Inflammatory conditions of jaws: Secondary | ICD-10-CM | POA: Diagnosis present

## 2023-01-13 DIAGNOSIS — M869 Osteomyelitis, unspecified: Secondary | ICD-10-CM | POA: Diagnosis present

## 2023-01-13 DIAGNOSIS — Z720 Tobacco use: Secondary | ICD-10-CM | POA: Diagnosis not present

## 2023-01-13 DIAGNOSIS — I10 Essential (primary) hypertension: Secondary | ICD-10-CM | POA: Diagnosis present

## 2023-01-13 DIAGNOSIS — M8618 Other acute osteomyelitis, other site: Secondary | ICD-10-CM | POA: Diagnosis not present

## 2023-01-13 DIAGNOSIS — Z8249 Family history of ischemic heart disease and other diseases of the circulatory system: Secondary | ICD-10-CM | POA: Diagnosis not present

## 2023-01-13 DIAGNOSIS — I25119 Atherosclerotic heart disease of native coronary artery with unspecified angina pectoris: Secondary | ICD-10-CM | POA: Diagnosis not present

## 2023-01-13 LAB — CBC
HCT: 40.9 % (ref 39.0–52.0)
Hemoglobin: 13.5 g/dL (ref 13.0–17.0)
MCH: 30.3 pg (ref 26.0–34.0)
MCHC: 33 g/dL (ref 30.0–36.0)
MCV: 91.7 fL (ref 80.0–100.0)
Platelets: 287 10*3/uL (ref 150–400)
RBC: 4.46 MIL/uL (ref 4.22–5.81)
RDW: 12.5 % (ref 11.5–15.5)
WBC: 10.2 10*3/uL (ref 4.0–10.5)
nRBC: 0 % (ref 0.0–0.2)

## 2023-01-13 LAB — BASIC METABOLIC PANEL
Anion gap: 11 (ref 5–15)
BUN: 30 mg/dL — ABNORMAL HIGH (ref 8–23)
CO2: 20 mmol/L — ABNORMAL LOW (ref 22–32)
Calcium: 10.2 mg/dL (ref 8.9–10.3)
Chloride: 105 mmol/L (ref 98–111)
Creatinine, Ser: 1.26 mg/dL — ABNORMAL HIGH (ref 0.61–1.24)
GFR, Estimated: >60 mL/min (ref >60)
Glucose, Bld: 144 mg/dL — ABNORMAL HIGH (ref 70–99)
Potassium: 4.4 mmol/L (ref 3.5–5.1)
Sodium: 136 mmol/L (ref 135–145)

## 2023-01-13 LAB — LACTIC ACID, PLASMA: Lactic Acid, Venous: 0.8 mmol/L (ref 0.5–1.9)

## 2023-01-13 MED ORDER — ONDANSETRON HCL 4 MG/2ML IJ SOLN: 4.0000 mg | Freq: Four times a day (QID) | INTRAMUSCULAR | Status: DC | PRN

## 2023-01-13 MED ORDER — MORPHINE SULFATE (PF) 2 MG/ML IV SOLN: 2.0000 mg | INTRAVENOUS | Status: DC | PRN

## 2023-01-13 MED ORDER — LORATADINE 10 MG PO TABS
10.0000 mg | ORAL_TABLET | Freq: Every day | ORAL | Status: DC
  Administered 2023-01-14 – 2023-01-18 (×5): 10 mg via ORAL
  Filled 2023-01-13 (×5): qty 1

## 2023-01-13 MED ORDER — OXYCODONE HCL 5 MG PO TABS: 5.0000 mg | ORAL_TABLET | ORAL | Status: DC | PRN

## 2023-01-13 MED ORDER — NICOTINE 21 MG/24HR TD PT24
21.0000 mg | MEDICATED_PATCH | Freq: Every day | TRANSDERMAL | Status: DC
  Administered 2023-01-14 – 2023-01-18 (×5): 21 mg via TRANSDERMAL
  Filled 2023-01-13 (×5): qty 1

## 2023-01-13 MED ORDER — SODIUM CHLORIDE 0.9 % IV SOLN
2.0000 g | INTRAVENOUS | Status: DC
  Administered 2023-01-14: 2 g via INTRAVENOUS
  Filled 2023-01-13: qty 20

## 2023-01-13 MED ORDER — SODIUM CHLORIDE 0.9 % IV BOLUS
1000.0000 mL | Freq: Once | INTRAVENOUS | Status: AC
  Administered 2023-01-13: 1000 mL via INTRAVENOUS

## 2023-01-13 MED ORDER — ACETAMINOPHEN 650 MG RE SUPP: 650.0000 mg | Freq: Four times a day (QID) | RECTAL | Status: DC | PRN

## 2023-01-13 MED ORDER — ONDANSETRON HCL 4 MG PO TABS: 4.0000 mg | ORAL_TABLET | Freq: Four times a day (QID) | ORAL | Status: DC | PRN

## 2023-01-13 MED ORDER — ACETAMINOPHEN 325 MG PO TABS
650.0000 mg | ORAL_TABLET | Freq: Four times a day (QID) | ORAL | Status: DC | PRN
  Filled 2023-01-13: qty 2

## 2023-01-13 MED ORDER — HEPARIN SODIUM (PORCINE) 5000 UNIT/ML IJ SOLN
5000.0000 [IU] | Freq: Three times a day (TID) | INTRAMUSCULAR | Status: DC
  Administered 2023-01-14 – 2023-01-18 (×15): 5000 [IU] via SUBCUTANEOUS
  Filled 2023-01-13 (×15): qty 1

## 2023-01-13 MED ORDER — PIPERACILLIN-TAZOBACTAM 3.375 G IVPB 30 MIN
3.3750 g | Freq: Once | INTRAVENOUS | Status: AC
  Administered 2023-01-13: 3.375 g via INTRAVENOUS
  Filled 2023-01-13: qty 50

## 2023-01-13 MED ORDER — ATORVASTATIN CALCIUM 20 MG PO TABS
40.0000 mg | ORAL_TABLET | Freq: Every day | ORAL | Status: DC
  Administered 2023-01-14 – 2023-01-18 (×5): 40 mg via ORAL
  Filled 2023-01-13 (×5): qty 2

## 2023-01-13 MED ORDER — ASPIRIN 81 MG PO TBEC
81.0000 mg | DELAYED_RELEASE_TABLET | Freq: Every day | ORAL | Status: DC
  Administered 2023-01-14 – 2023-01-18 (×5): 81 mg via ORAL
  Filled 2023-01-13 (×5): qty 1

## 2023-01-13 MED ORDER — IOHEXOL 300 MG/ML  SOLN
75.0000 mL | Freq: Once | INTRAMUSCULAR | Status: AC | PRN
  Administered 2023-01-13: 75 mL via INTRAVENOUS

## 2023-01-13 MED ORDER — PANCRELIPASE (LIP-PROT-AMYL) 36000-114000 UNITS PO CPEP
72000.0000 [IU] | ORAL_CAPSULE | Freq: Three times a day (TID) | ORAL | Status: DC
  Administered 2023-01-14 – 2023-01-18 (×13): 72000 [IU] via ORAL
  Filled 2023-01-13 (×16): qty 2

## 2023-01-13 MED ORDER — VANCOMYCIN HCL IN DEXTROSE 1-5 GM/200ML-% IV SOLN
1000.0000 mg | Freq: Once | INTRAVENOUS | Status: AC
  Administered 2023-01-13: 1000 mg via INTRAVENOUS
  Filled 2023-01-13: qty 200

## 2023-01-13 NOTE — ED Triage Notes (Signed)
Pt sent from pmd for iv abx.  Pt had an abscess to his chin.  Pmd concerned for osteomyelitis  pt has pain in neck area and chin.  Pt alert  speech clear.

## 2023-01-13 NOTE — Assessment & Plan Note (Signed)
Continue aspirin and statin. 

## 2023-01-13 NOTE — H&P (Signed)
History and Physical    Patient: Gabriel Grant:[id number] DOB: [birth year] DOA: 01/13/2023 DOS: the patient was seen and examined on 01/13/2023 PCP: Olena Leatherwood, FNP  Patient coming from: Home  Chief Complaint:  Chief Complaint  Patient presents with   Abscess   HPI: Gabriel Grant is a 62 y.o. male with medical history significant of CAD, tobacco use disorder, HLD, HTN, and more presents to the ED with a chief complaint "jae infection." Patient reports that he had all of his teeth pulled in October for dentures. Since then he has had a bump under his chin. He reports that at first it was small like a pimple. He pointed it out to the dentists/oral surgeons at follow up appointments, but they reportedly told him it was part of the normal healing process. The are has been progressively larger, and has become progressively more firm. On Tuesday it started draining. He reports that the drainage was a milky brown color. He went to his PCP who expressed as much fluid as he could from it. Patient had follow up with his PCP today with a plan to lance the area. They did an xray first that showed signs of osteomyelitis and he was referred to the ER. Patient does report that he was started on Augmentin at the first appointment on Tuesday. He has been taking it as prescribed. He reports no fevers. He does have pain at the area. He describes it as stinging and burning on palpation. Moving his neck makes the pain worse. If there is no palpation, and he holds completely still then he has no pain. Patient has no other complaints at this time.   Patient does smoke 1 ppd. He does not drink. He is not vaxed for covid nor flu. He is full code.   Review of Systems: As mentioned in the history of present illness. All other systems reviewed and are negative. Past Medical History:  Diagnosis Date   Acute MI ([facility]) 11/14/2016   CAD (coronary artery disease)    Hyperlipidemia    Hypertension    Past Surgical  History:  Procedure Laterality Date   CARDIAC CATHETERIZATION     CHOLECYSTECTOMY  2020   COLONOSCOPY WITH PROPOFOL N/A 06/23/2021   Procedure: COLONOSCOPY WITH PROPOFOL;  Surgeon: Toney Reil, MD;  Location: [facility] ENDOSCOPY;  Service: Gastroenterology;  Laterality: N/A;   COLONOSCOPY WITH PROPOFOL N/A 11/30/2022   Procedure: COLONOSCOPY WITH PROPOFOL;  Surgeon: Toney Reil, MD;  Location: [facility] ENDOSCOPY;  Service: Gastroenterology;  Laterality: N/A;   CORONARY STENT INTERVENTION Right 11/15/2016   Procedure: Coronary Angiogram;  Surgeon: Laurier Nancy, MD;  Location: [facility] INVASIVE CV LAB;  Service: Cardiovascular;  Laterality: Right;   CORONARY STENT INTERVENTION N/A 11/15/2016   Procedure: Coronary Stent Intervention;  Surgeon: Alwyn Pea, MD;  Location: [facility] INVASIVE CV LAB;  Service: Cardiovascular;  Laterality: N/A;   ESOPHAGOGASTRODUODENOSCOPY N/A 06/23/2021   Procedure: ESOPHAGOGASTRODUODENOSCOPY (EGD);  Surgeon: Toney Reil, MD;  Location: [facility] ENDOSCOPY;  Service: Gastroenterology;  Laterality: N/A;   LEFT HEART CATH AND CORONARY ANGIOGRAPHY Right 11/15/2016   Procedure: Left Heart Cath;  Surgeon: Laurier Nancy, MD;  Location: [facility] INVASIVE CV LAB;  Service: Cardiovascular;  Laterality: Right;   TONSILLECTOMY     Social History:  reports that he has been smoking cigarettes. He has been smoking an average of .5 packs per day. He has never used smokeless tobacco. He reports that he does not drink alcohol and  does not use drugs.  Allergies  Allergen Reactions   Codeine Other (See Comments)    Severe headache.   Sulfa Antibiotics Rash    Rash, blisters, photo sensitivity, and some difficulty breathing    Family History  Problem Relation Age of Onset   Cancer Mother        breast   Hyperlipidemia Mother    Hypertension Father    CAD Father    CAD Paternal Grandfather     Prior to Admission medications   Medication Sig Start Date End Date  Taking? Authorizing Provider  amoxicillin-clavulanate (AUGMENTIN) 875-125 MG tablet Take 1 tablet by mouth 2 (two) times daily. 01/11/23 01/21/23 Yes [provider]  aspirin EC 81 MG EC tablet Take 1 tablet (81 mg total) by mouth daily. 11/16/16  Yes Wieting, Richard, MD  atorvastatin (LIPITOR) 40 MG tablet Take 1 tablet by mouth daily. 04/13/22 04/14/23 Yes [provider]  diclofenac (VOLTAREN) 75 MG EC tablet Take 75 mg by mouth 2 (two) times daily. 01/11/23 01/11/24 Yes [provider]  lipase/protease/amylase (CREON) 36000 UNITS CPEP capsule TAKE 2 CAPSULES BY MOUTH WITH FIRST BITE OF [facility] MEAL AND 1 CAPSULE WITH FIRST BITE OF EACH SNACK 10/20/22  Yes Vanga, Loel Dubonnet, MD  loratadine (CLARITIN) 10 MG tablet Take 10 mg by mouth daily.   Yes [provider]  NAPROXEN PO Take by mouth as needed.   Yes [provider]  nitroGLYCERIN (NITROSTAT) 0.4 MG SL tablet Place 1 tablet (0.4 mg total) under the tongue every 5 (five) minutes as needed for chest pain. 11/16/16  Yes Alford Highland, MD    Physical Exam: Vitals:   01/13/23 1822 01/13/23 1823 01/13/23 2038  BP: 120/81  (!) 155/94  Pulse: 77  60  Resp: 18  16  Temp: 98.3 F (36.8 C)  98.1 F (36.7 C)  TempSrc: Oral  Oral  SpO2: 96%  100%  Weight:  68.5 kg   Height:  5\' 10"  (1.778 m)    1.  General: Patient lying supine in bed,  no acute distress   2. Psychiatric: Alert and oriented x 3, mood and behavior normal for situation, pleasant and cooperative with exam   3. Neurologic: Speech and language are normal, face is symmetric, moves all 4 extremities voluntarily, at baseline without acute deficits on limited exam   4. HEENMT:  Head is atraumatic, normocephalic, pupils reactive to light, neck is supple, trachea is midline, mucous membranes are moist   5. Respiratory : Mild wheezing on exam - likely chronic, no rhonchi, rales, no cyanosis, no increase in work of breathing or accessory muscle  use   6. Cardiovascular : Heart rate normal, rhythm is regular, no murmurs, rubs or gallops, no peripheral edema, peripheral pulses palpated   7. Gastrointestinal:  Abdomen is soft, nondistended, nontender to palpation bowel sounds active, no masses or organomegaly palpated   8. Skin:  Erythematous are approx the size of a quarter at the body of the mandible. There is induration and sero-sanguinous drainage.    9.Musculoskeletal:  No acute deformities or trauma, no asymmetry in tone, no peripheral edema, peripheral pulses palpated, no tenderness to palpation in the extremities  Data Reviewed: In the ED Temp 98.1, heart rate 60-97, respiratory 16-18, blood pressure 120/81-155/94 No leukocytosis with white blood cell count of 10.2, hemoglobin 13.5, platelets 287 Chemistry reveals a bump in creatinine at 1.26 Blood cultures pending CT maxillofacial shows inflammatory changes of the submandibular soft tissue extending to  the skin surface no abscess or drainable collection.  Extensive erosive changes of the mandible body Admission requested for osteomyelitis  Assessment and Plan: * Osteomyelitis (HCC) - Maxillofacial CT osteomyelitis of the mandible with cellulitis and no ongoing abscess. - Patient was started on vancomycin and Zosyn in the ED - Continue vancomycin and Rocephin - Blood cultures pending - Consult infectious disease - Does not meet SIRS/sepsis criteria - Triggered by oral surgery - Continue to monitor  Cellulitis - Erythema abscess that has mostly drained, overlying osteomyelitis - Covered with Vanco and Rocephin - Blood cultures pending - Continue to monitor  Tobacco use - Counseled on importance of cessation especially in the setting of CAD - Nicotine patch ordered  CAD (coronary artery disease) - Continue aspirin and statin      Advance Care Planning:   Code Status: Full Code   Consults: infectious disease  Family Communication: no family at  bedside  Severity of Illness: The appropriate patient status for this patient is INPATIENT. Inpatient status is judged to be reasonable and necessary in order to provide the required intensity of service to ensure the patient's safety. The patient's presenting symptoms, physical exam findings, and initial radiographic and laboratory data in the context of their chronic comorbidities is felt to place them at high risk for further clinical deterioration. Furthermore, it is not anticipated that the patient will be medically stable for discharge from the hospital within 2 midnights of admission.   * I certify that at the point of admission it is my clinical judgment that the patient will require inpatient hospital care spanning beyond 2 midnights from the point of admission due to high intensity of service, high risk for further deterioration and high frequency of surveillance required.*  Author: Lilyan Gilford, DO 01/13/2023 11:04 PM  For on call review [email or web address].

## 2023-01-13 NOTE — Assessment & Plan Note (Signed)
-   Counseled on importance of cessation especially in the setting of CAD - Nicotine patch ordered

## 2023-01-13 NOTE — ED Provider Notes (Signed)
[facility] Provider Note    Event Date/Time   First MD Initiated Contact with Patient 01/13/23 1954     (approximate)   History   Chief Complaint Abscess   HPI  Gabriel Grant is a 62 y.o. male with past medical history of hypertension, hyperlipidemia, and CAD who presents to the ED complaining of abscess.  Patient reports that he had multiple teeth pulled from his lower jaw by a dentist 3 to 4 months ago.  He states he developed a bump underneath his jaw following the procedure but was told it was part of the normal healing process by his dentist.  He states that the bump then significantly increased in size and became more tender over about the past week.  He saw his PCP for this problem earlier this week and had an x-ray that was concerning for bony degeneration and possible osteomyelitis.  His PCP had attempted to drain the bump with I&D procedure in the office earlier today, but patient states there was minimal drainage.  He was referred to the ED for further evaluation due to concern for osteomyelitis.  He denies any fevers, chills, nausea, or vomiting.  He has not been taking any antibiotics for this problem.     Physical Exam   Triage Vital Signs: ED Triage Vitals  Enc Vitals Group     BP 01/13/23 1822 120/81     Pulse Rate 01/13/23 1822 77     Resp 01/13/23 1822 18     Temp 01/13/23 1822 98.3 F (36.8 C)     Temp Source 01/13/23 1822 Oral     SpO2 01/13/23 1822 96 %     Weight 01/13/23 1823 151 lb (68.5 kg)     Height 01/13/23 1823 5\' 10"  (1.778 m)     Head Circumference --      Peak Flow --      Pain Score 01/13/23 1822 4     Pain Loc --      Pain Edu? --      Excl. in GC? --     Most recent vital signs: Vitals:   01/13/23 1822 01/13/23 2038  BP: 120/81 (!) 155/94  Pulse: 77 60  Resp: 18 16  Temp: 98.3 F (36.8 C) 98.1 F (36.7 C)  SpO2: 96% 100%    Constitutional: Alert and oriented. Eyes: Conjunctivae are normal. Head:  Atraumatic. Nose: No congestion/rhinnorhea. Mouth/Throat: Mucous membranes are moist.  Erythema, edema, and induration just inferior to the most anterior portion of the mandible, small incision noted with small amount of packing in place and no fluctuance.  Small opening to anterior portion of mandible on the left with a small amount of purulent drainage, no edema or fluctuance noted on anterior of mouth. Cardiovascular: Normal rate, regular rhythm. Grossly normal heart sounds.  2+ radial pulses bilaterally. Respiratory: Normal respiratory effort.  No retractions. Lungs CTAB. Gastrointestinal: Soft and nontender. No distention. Musculoskeletal: No lower extremity tenderness nor edema.  Neurologic:  Normal speech and language. No gross focal neurologic deficits are appreciated.    ED Results / Procedures / Treatments   Labs (all labs ordered are listed, but only abnormal results are displayed) Labs Reviewed  BASIC METABOLIC PANEL - Abnormal; Notable for the following components:      Result Value   CO2 20 (*)    Glucose, Bld 144 (*)    BUN 30 (*)    Creatinine, Ser 1.26 (*)    All other  components within normal limits  CULTURE, BLOOD (ROUTINE X 2)  CULTURE, BLOOD (ROUTINE X 2)  CBC  LACTIC ACID, PLASMA   RADIOLOGY CT maxillofacial with contrast reviewed and interpreted by me with inflammatory changes and bony destruction in the anterior mandible with no focal fluid collection.  PROCEDURES:  Critical Care performed: No  Procedures   MEDICATIONS ORDERED IN ED: Medications  vancomycin (VANCOCIN) IVPB 1000 mg/200 mL premix (1,000 mg Intravenous New Bag/Given 01/13/23 2119)  piperacillin-tazobactam (ZOSYN) IVPB 3.375 g (0 g Intravenous Stopped 01/13/23 2120)  sodium chloride 0.9 % bolus 1,000 mL (0 mLs Intravenous Stopped 01/13/23 2120)  iohexol (OMNIPAQUE) 300 MG/ML solution 75 mL (75 mLs Intravenous Contrast Given 01/13/23 2045)     IMPRESSION / MDM / ASSESSMENT AND PLAN / ED  COURSE  I reviewed the triage vital signs and the nursing notes.                              62 y.o. male with past medical history of hypertension, hyperlipidemia, and CAD who presents to the ED complaining of pain and swelling underneath his jaw for the past 3 months, now rapidly increasing over the past week.  Patient's presentation is most consistent with acute presentation with potential threat to life or bodily function.  Differential diagnosis includes, but is not limited to, abscess, cellulitis, osteomyelitis, sepsis.  Patient nontoxic-appearing and in no acute distress, vital signs are unremarkable.  He has evidence of cellulitis just underneath his mandible on exam, abscess appears to previously have been drained by his PCP with no ongoing fluctuance.  CT imaging performed and concerning for osteomyelitis and cellulitis but no evidence of ongoing abscess.  Vital signs are reassuring and not concerning for sepsis, patient started on IV Vanco and Zosyn.  Labs without significant anemia, leukocytosis, tract abnormality, or AKI.  Blood cultures were drawn and lactic acid within normal limits.  Case discussed with hospitalist for admission.      FINAL CLINICAL IMPRESSION(S) / ED DIAGNOSES   Final diagnoses:  Acute osteomyelitis of mandible     Rx / DC Orders   ED Discharge Orders     None        Note:  This document was prepared using Dragon voice recognition software and may include unintentional dictation errors.   Chesley Noon, MD 01/13/23 2139

## 2023-01-13 NOTE — Assessment & Plan Note (Addendum)
Mandible - in setting of oral surgery back on October 2023.  Maxillofacial CT shows osteomyelitis of the mandible with cellulitis and no identifiable abscess. Clinically Improving on IV antibiotics. - Given IV vancomycin, Zosyn in ED - Started on empiric vancomycin, Rocephin on admission --Infectious disease consulted --Transitioned to IV Unasyn  --Left UE PICC line - placed  --Will d/c on IV ampicillin +PO Flagyl --ID recommends 6 wks IV antibiotics --OMFS consult with Dr. Mauri [facility] at [facility]. He reviewed CT images and case details 01/15/23.   Recommended continued antibiotics and close outpatient follow up for likely surgery in the future, but no need for acute surgical intervention.  Pt to call his coordinator Shanda Bumps to schedule appointment [phone]) - Blood cultures pending --Monitor closely

## 2023-01-13 NOTE — ED Notes (Signed)
Patient to CT.

## 2023-01-13 NOTE — Assessment & Plan Note (Addendum)
See osteomyelitis 

## 2023-01-14 DIAGNOSIS — L03211 Cellulitis of face: Secondary | ICD-10-CM | POA: Diagnosis not present

## 2023-01-14 DIAGNOSIS — M8618 Other acute osteomyelitis, other site: Secondary | ICD-10-CM

## 2023-01-14 LAB — COMPREHENSIVE METABOLIC PANEL
ALT: 15 U/L (ref 0–44)
AST: 22 U/L (ref 15–41)
Albumin: 3.7 g/dL (ref 3.5–5.0)
Alkaline Phosphatase: 77 U/L (ref 38–126)
Anion gap: 8 (ref 5–15)
BUN: 24 mg/dL — ABNORMAL HIGH (ref 8–23)
CO2: 23 mmol/L (ref 22–32)
Calcium: 10.1 mg/dL (ref 8.9–10.3)
Chloride: 108 mmol/L (ref 98–111)
Creatinine, Ser: 1.06 mg/dL (ref 0.61–1.24)
GFR, Estimated: >60 mL/min (ref >60)
Glucose, Bld: 104 mg/dL — ABNORMAL HIGH (ref 70–99)
Potassium: 4.3 mmol/L (ref 3.5–5.1)
Sodium: 139 mmol/L (ref 135–145)
Total Bilirubin: 0.7 mg/dL (ref 0.3–1.2)
Total Protein: 7.1 g/dL (ref 6.5–8.1)

## 2023-01-14 LAB — CBC WITH DIFFERENTIAL/PLATELET
Abs Immature Granulocytes: 0.04 10*3/uL (ref 0.00–0.07)
Basophils Absolute: 0 10*3/uL (ref 0.0–0.1)
Basophils Relative: 0 %
Eosinophils Absolute: 0.6 10*3/uL — ABNORMAL HIGH (ref 0.0–0.5)
Eosinophils Relative: 6 %
HCT: 39.7 % (ref 39.0–52.0)
Hemoglobin: 13.1 g/dL (ref 13.0–17.0)
Immature Granulocytes: 0 %
Lymphocytes Relative: 28 %
Lymphs Abs: 2.6 10*3/uL (ref 0.7–4.0)
MCH: 30.4 pg (ref 26.0–34.0)
MCHC: 33 g/dL (ref 30.0–36.0)
MCV: 92.1 fL (ref 80.0–100.0)
Monocytes Absolute: 0.7 10*3/uL (ref 0.1–1.0)
Monocytes Relative: 8 %
Neutro Abs: 5.3 10*3/uL (ref 1.7–7.7)
Neutrophils Relative %: 58 %
Platelets: 256 10*3/uL (ref 150–400)
RBC: 4.31 MIL/uL (ref 4.22–5.81)
RDW: 12.6 % (ref 11.5–15.5)
WBC: 9.2 10*3/uL (ref 4.0–10.5)
nRBC: 0 % (ref 0.0–0.2)

## 2023-01-14 LAB — HIV ANTIBODY (ROUTINE TESTING W REFLEX): HIV Screen 4th Generation wRfx: NONREACTIVE

## 2023-01-14 LAB — MRSA NEXT GEN BY PCR, NASAL: MRSA by PCR Next Gen: NOT DETECTED

## 2023-01-14 LAB — MAGNESIUM: Magnesium: 2.1 mg/dL (ref 1.7–2.4)

## 2023-01-14 LAB — C-REACTIVE PROTEIN: CRP: 1.4 mg/dL — ABNORMAL HIGH (ref <1.0)

## 2023-01-14 LAB — SEDIMENTATION RATE: Sed Rate: 18 mm/hr (ref 0–20)

## 2023-01-14 MED ORDER — CHLORHEXIDINE GLUCONATE 0.12 % MT SOLN
15.0000 mL | Freq: Two times a day (BID) | OROMUCOSAL | Status: DC
  Administered 2023-01-14 – 2023-01-18 (×9): 15 mL via OROMUCOSAL
  Filled 2023-01-14 (×8): qty 15

## 2023-01-14 MED ORDER — ENSURE ENLIVE PO LIQD
237.0000 mL | Freq: Three times a day (TID) | ORAL | Status: DC
  Administered 2023-01-14 – 2023-01-18 (×10): 237 mL via ORAL

## 2023-01-14 MED ORDER — PANCRELIPASE (LIP-PROT-AMYL) 36000-114000 UNITS PO CPEP: 36000.0000 [IU] | ORAL_CAPSULE | ORAL | Status: DC | PRN

## 2023-01-14 MED ORDER — VANCOMYCIN HCL 1250 MG/250ML IV SOLN
1250.0000 mg | INTRAVENOUS | Status: DC
  Filled 2023-01-14: qty 250

## 2023-01-14 MED ORDER — PNEUMOCOCCAL 20-VAL CONJ VACC 0.5 ML IM SUSY
0.5000 mL | PREFILLED_SYRINGE | INTRAMUSCULAR | Status: DC
  Filled 2023-01-14: qty 0.5

## 2023-01-14 MED ORDER — VANCOMYCIN HCL 1500 MG/300ML IV SOLN
1500.0000 mg | INTRAVENOUS | Status: DC
  Filled 2023-01-14: qty 300

## 2023-01-14 MED ORDER — SODIUM CHLORIDE 0.9 % IV SOLN
3.0000 g | Freq: Four times a day (QID) | INTRAVENOUS | Status: DC
  Administered 2023-01-14 – 2023-01-18 (×17): 3 g via INTRAVENOUS
  Filled 2023-01-14 (×4): qty 8
  Filled 2023-01-14: qty 3
  Filled 2023-01-14: qty 8
  Filled 2023-01-14: qty 3
  Filled 2023-01-14 (×6): qty 8
  Filled 2023-01-14 (×2): qty 3
  Filled 2023-01-14 (×4): qty 8

## 2023-01-14 NOTE — ED Notes (Signed)
Attending to bedside at this time

## 2023-01-14 NOTE — Plan of Care (Signed)

## 2023-01-14 NOTE — Consult Note (Addendum)
[facility]                                                                                        Patient Identification: Patient Name: Gabriel Grant MRN: [id number] Admit Date: 01/13/2023  7:49 PM Today's Date: 01/14/2023 Reason for consult: mandibular osteomyelitis  Requesting provider:   Principal Problem:   Osteomyelitis (HCC) Active Problems:   CAD (coronary artery disease)   Tobacco use   Cellulitis   Antibiotics:  Augmentin pta  Vancomycin 5/2 Zosyn 5/2 Ceftriaxone 5/2  Lines/Hardware:  Assessment 62 year old male with CAD status post intervention, HLD, HTN who was sent to ED from PCP on 5/2 with concerns for   # Mandibular osteomyelitis with sub mandibular sinus( chronic vs acute on chronic) with associated inflammatory changeof the submandibular soft tissue - possibly odontogenic in origin  5/2 HIV NR  No known immunocompromising conditions or medications/steroids. Low concerns for MRSA or PsA   Weight loss/Pain while chewing/Smoker - Malignancy considered in the differential given his age, however currently weight loss appears to be related  avoiding  eating due to pain as well as need to cut food into small pieces since he had teeth pulled out in October  Recommendations  Will switch IV abtx to Unasyn to cover streptococcus as well as oral anaerobes which are common pathogens associated with odontogenic infection  D/w Dr Denton Lank to get OMFS eval via phone even if not present in house for +/- need for surgical intervention  Will get baseline ESR and CRP  Monitor CBC and CMP on abtx  Dr Thedore Mins covering remotely this weekend.   Rest of the management as per the primary team. Please call with questions or concerns.  Thank you for the consult  __________________________________________________________________________________________________________ HPI and Hospital  Course: 62 year old male with CAD status post intervention, HLD, HTN who was sent to ED from PCP on 5/2 with concerns for [facility] osteomyelitis.   Reports he had his teeth pulled out in October 2023 for dentures which was followed by a bump under his chin which was followed by on and off swelling in the submental region.  He discussed about it during follow-up appointment with his dentist who said that it was part of the healing.  It was initially size of a pimple but then gradually increasing in size and getting firmer. Last Saturday, it enlarged to the size of an egg with some pain.  Seen by PCP on Tuesday, who squeezed the scrotal swelling as much as possible were milky brown color discharge was expressed.  Plan was for I&D.  x-ray was done with concern for osteomyelitis and was referred to the ED. he was prescribed Augmentin which he has been taking since Tuesday without missing doses.   Denies fever, chills, sweats Denies nausea vomiting or diarrhea or abdominal pain.  Denies GU symptoms He reports he has difficulty eating since his teeth pulled out as it would hurt his mouth when eating.  He will need to cut a burger into several pieces to eat.  He reports she has lost approximately 20 pounds since October due to reduced eating secondary  to problem with chewing. Smokes 1 to 1/2 pack of cigarettes a day, denies alcohol and IVDU  At ED, afebrile  Labs remarkable for no leukocytosis.  Imaging reviewed  ID consulted for mandibular osteomyelitis   ROS: General- Denies fever, chills HEENT - Denies headache, blurry vision, neck pain, sinus pain Chest - Denies any chest pain, SOB or cough CVS- Denies any dizziness/lightheadedness, syncopal attacks, palpitations Abdomen- Denies any nausea, vomiting, abdominal pain, hematochezia and diarrhea Neuro - Denies any weakness, numbness, tingling sensation Psych - Denies any changes in mood irritability or depressive symptoms GU- Denies any burning, dysuria,  hematuria or increased frequency of urination Skin - denies any rashes/lesions MSK - denies any joint pain/swelling or restricted ROM   Past Medical History:  Diagnosis Date   Acute MI (HCC) 11/14/2016   CAD (coronary artery disease)    Hyperlipidemia    Hypertension    Past Surgical History:  Procedure Laterality Date   CARDIAC CATHETERIZATION     CHOLECYSTECTOMY  2020   COLONOSCOPY WITH PROPOFOL N/A 06/23/2021   Procedure: COLONOSCOPY WITH PROPOFOL;  Surgeon: Toney Reil, MD;  Location: [facility] ENDOSCOPY;  Service: Gastroenterology;  Laterality: N/A;   COLONOSCOPY WITH PROPOFOL N/A 11/30/2022   Procedure: COLONOSCOPY WITH PROPOFOL;  Surgeon: Toney Reil, MD;  Location: [facility] ENDOSCOPY;  Service: Gastroenterology;  Laterality: N/A;   CORONARY STENT INTERVENTION Right 11/15/2016   Procedure: Coronary Angiogram;  Surgeon: Laurier Nancy, MD;  Location: [facility] INVASIVE CV LAB;  Service: Cardiovascular;  Laterality: Right;   CORONARY STENT INTERVENTION N/A 11/15/2016   Procedure: Coronary Stent Intervention;  Surgeon: Alwyn Pea, MD;  Location: [facility] INVASIVE CV LAB;  Service: Cardiovascular;  Laterality: N/A;   ESOPHAGOGASTRODUODENOSCOPY N/A 06/23/2021   Procedure: ESOPHAGOGASTRODUODENOSCOPY (EGD);  Surgeon: Toney Reil, MD;  Location: [facility] ENDOSCOPY;  Service: Gastroenterology;  Laterality: N/A;   LEFT HEART CATH AND CORONARY ANGIOGRAPHY Right 11/15/2016   Procedure: Left Heart Cath;  Surgeon: Laurier Nancy, MD;  Location: [facility] INVASIVE CV LAB;  Service: Cardiovascular;  Laterality: Right;   TONSILLECTOMY      Scheduled Meds:  aspirin EC  81 mg Oral Daily   atorvastatin  40 mg Oral Daily   chlorhexidine  15 mL Mouth/Throat BID   feeding supplement  237 mL Oral TID BM   heparin  5,000 Units Subcutaneous Q8H   lipase/protease/amylase  72,000 Units Oral TID WC   loratadine  10 mg Oral Daily   nicotine  21 mg Transdermal Daily   Continuous Infusions:   cefTRIAXone (ROCEPHIN)  IV Stopped (01/14/23 0139)   vancomycin     PRN Meds:.acetaminophen **OR** acetaminophen, lipase/protease/amylase, morphine injection, ondansetron **OR** ondansetron (ZOFRAN) IV, oxyCODONE  Allergies  Allergen Reactions   Codeine Other (See Comments)    Severe headache.   Sulfa Antibiotics Rash    Rash, blisters, photo sensitivity, and some difficulty breathing   Social History   Socioeconomic History   Marital status: Married    Spouse name: Not on file   Number of children: Not on file   Years of education: Not on file   Highest education level: Not on file  Occupational History   Not on file  Tobacco Use   Smoking status: Every Day    Packs/day: .5    Types: Cigarettes   Smokeless tobacco: Never   Tobacco comments:    trying to quit  Vaping Use   Vaping Use: Never used  Substance and Sexual Activity   Alcohol use: No  Drug use: No   Sexual activity: Not on file  Other Topics Concern   Not on file  Social History Narrative   Not on file   Social Determinants of Health   Financial Resource Strain: Not on file  Food Insecurity: Not on file  Transportation Needs: Not on file  Physical Activity: Not on file  Stress: Not on file  Social Connections: Not on file  Intimate Partner Violence: Not on file   Family History  Problem Relation Age of Onset   Cancer Mother        breast   Hyperlipidemia Mother    Hypertension Father    CAD Father    CAD Paternal Grandfather    Vitals BP 137/76 (BP Location: Left Arm)   Pulse 65   Temp 98.1 F (36.7 C) (Oral)   Resp 19   Ht 5\' 10"  (1.778 m)   Wt 68.5 kg   SpO2 96%   BMI 21.67 kg/m   Physical Exam Constitutional: Adult male lying in the bed and not in acute distress    Comments: Small swelling 2 x 2 centimeter in the submental region which has mild erythema as well as indurated with a central opening with minimal serous fluid when squeezed  Cardiovascular:     Rate and Rhythm:  Normal rate and regular rhythm.     Heart sounds: S1 and S2  Pulmonary:     Effort: Pulmonary effort is normal on room air    Comments: Normal breath sounds  Abdominal:     Palpations: Abdomen is soft.     Tenderness: Nondistended and nontender  Musculoskeletal:        General: No swelling or tenderness in peripheral joints  Skin:    Comments: No rashes, has tatoos  Neurological:     General: awake, alert and oriented, grossly non focal and following comamnds  Psychiatric:        Mood and Affect: Mood normal.    Pertinent Microbiology Results for orders placed or performed during the hospital encounter of 01/13/23  Culture, blood (routine x 2)     Status: None (Preliminary result)   Collection Time: 01/13/23  8:28 PM   Specimen: BLOOD  Result Value Ref Range Status   Specimen Description BLOOD BLOOD RIGHT ARM  Final   Special Requests   Final    BOTTLES DRAWN AEROBIC AND ANAEROBIC Blood Culture adequate volume   Culture   Final    NO GROWTH < 12 HOURS Performed at [facility], [address] [place], [place] [address]    Report Status PENDING  Incomplete  Culture, blood (routine x 2)     Status: None (Preliminary result)   Collection Time: 01/13/23  8:28 PM   Specimen: BLOOD  Result Value Ref Range Status   Specimen Description BLOOD BLOOD LEFT ARM  Final   Special Requests   Final    BOTTLES DRAWN AEROBIC AND ANAEROBIC Blood Culture results may not be optimal due to an excessive volume of blood received in culture bottles   Culture   Final    NO GROWTH < 12 HOURS Performed at [facility], [address] [place], [place] [address]    Report Status PENDING  Incomplete     Pertinent Lab seen by me:    Latest Ref Rng & Units 01/14/2023    5:57 AM 01/13/2023    6:26 PM 11/04/2020   11:03 AM  CBC  WBC 4.0 - 10.5 K/uL 9.2  10.2  10.7   Hemoglobin 13.0 - 17.0 g/dL 29.5  62.1  30.8   Hematocrit 39.0 - 52.0 % 39.7  40.9  41.9   Platelets 150 -  400 K/uL 256  287  267       Latest Ref Rng & Units 01/14/2023    5:57 AM 01/13/2023    6:26 PM 11/04/2020   11:03 AM  CMP  Glucose 70 - 99 mg/dL 657  846  962   BUN 8 - 23 mg/dL 24  30  15    Creatinine 0.61 - 1.24 mg/dL 9.52  8.41  3.24   Sodium 135 - 145 mmol/L 139  136  136   Potassium 3.5 - 5.1 mmol/L 4.3  4.4  4.0   Chloride 98 - 111 mmol/L 108  105  103   CO2 22 - 32 mmol/L 23  20  26    Calcium 8.9 - 10.3 mg/dL 40.1  02.7  25.3   Total Protein 6.5 - 8.1 g/dL 7.1   7.4   Total Bilirubin 0.3 - 1.2 mg/dL 0.7   0.9   Alkaline Phos 38 - 126 U/L 77   70   AST 15 - 41 U/L 22   18   ALT 0 - 44 U/L 15   18      Pertinent Imagings/Other Imagings Plain films and CT images have been personally visualized and interpreted; radiology reports have been reviewed. Decision making incorporated into the Impression / Recommendations.  CT Maxillofacial W Contrast  Result Date: 01/13/2023 CLINICAL DATA:  Facial abscess EXAM: CT MAXILLOFACIAL WITH CONTRAST TECHNIQUE: Multidetector CT imaging of the maxillofacial structures was performed with intravenous contrast. Multiplanar CT image reconstructions were also generated. RADIATION DOSE REDUCTION: This exam was performed according to the departmental dose-optimization program which includes automated exposure control, adjustment of the mA and/or kV according to patient size and/or use of iterative reconstruction technique. CONTRAST:  75mL OMNIPAQUE IOHEXOL 300 MG/ML  SOLN COMPARISON:  None Available. FINDINGS: Osseous: Extensive erosive change of mandibular body asymmetric to left. Edentulous oral cavity. Orbits: Negative. No traumatic or inflammatory finding. Sinuses: Clear. Soft tissues: Inflammatory change of the submandibular soft tissues extending to the skin surface. No abscess or drainable fluid collection. Limited intracranial: No significant or unexpected finding. IMPRESSION: 1. Inflammatory change of the submandibular soft tissues extending to the skin  surface. No abscess or drainable fluid collection. 2. Extensive erosive change of the mandibular body asymmetric to left. This likely indicates chronic or acute on chronic osteomyelitis. Electronically Signed   By: Deatra Robinson M.D.   On: 01/13/2023 20:58     I have personally spent 85 minutes involved in face-to-face and non-face-to-face activities for this patient on the day of the visit. Professional time spent includes the following activities: Preparing to see the patient (review of tests), Obtaining and/or reviewing separately obtained history (admission/discharge record), Performing a medically appropriate examination and/or evaluation , Ordering medications/tests/procedures, referring and communicating with other health care professionals, Documenting clinical information in the EMR, Independently interpreting results (not separately reported), Communicating results to the patient/family/caregiver, Counseling and educating the patient/family/caregiver and Care coordination (not separately reported).  Electronically signed by:   Plan d/w requesting provider as well as ID pharm D  Note: This document was prepared using dragon voice recognition software and may include unintentional dictation errors.   Odette Fraction, MD Infectious Disease Physician [facility] Pager: [phone]

## 2023-01-14 NOTE — Progress Notes (Signed)
Pharmacy Antibiotic Note  Gabriel Grant is a 62 y.o. male admitted on 01/13/2023 with osteomyelitis.  Pharmacy has been consulted for Vancomycin dosing.  Plan: Antibiotics Day 2 Changed to Vancomycin 1500 mg IV Q 24 hrs. Goal AUC 400-550. Expected AUC: 480.8 Expected Css: 9.4 SCr used: 1.06, TBW 68.5 kg < IBW 73 kg  Pharmacy will continue to follow and will adjust abx dosing whenever warranted.  Temp (24hrs), Avg:98 F (36.7 C), Min:97.5 F (36.4 C), Max:98.3 F (36.8 C)   Recent Labs  Lab 01/13/23 1826 01/13/23 2028 01/14/23 0557  WBC 10.2  --  9.2  CREATININE 1.26*  --  1.06  LATICACIDVEN  --  0.8  --      Estimated Creatinine Clearance: 70.9 mL/min (by C-G formula based on SCr of 1.06 mg/dL).    Allergies  Allergen Reactions   Codeine Other (See Comments)    Severe headache.   Sulfa Antibiotics Rash    Rash, blisters, photo sensitivity, and some difficulty breathing    Antimicrobials this admission: 5/2 Zosyn >> x 1 dose 5/3 Ceftriaxone >>  5/2 Vancomycin >>  Microbiology results: 5/02 BCx: NGTD 5/03 MRSA PCR: pending  Thank you for allowing pharmacy to be a part of this patient's care.  Bettey Costa, PharmD Clinical Pharmacist 01/14/2023 12:04 PM

## 2023-01-14 NOTE — Progress Notes (Signed)
Pharmacy Antibiotic Note  Gabriel Grant is a 62 y.o. male admitted on 01/13/2023 with osteomyelitis.  Pharmacy has been consulted for Vancomycin dosing.  Plan: Pt given Vancomycin 1000 mg once. Vancomycin 1250 mg IV Q 24 hrs. Goal AUC 400-550. Expected AUC: 470.1 SCr used: 1.26, TBW 68.5 kg < IBW 73 kg  Pharmacy will continue to follow and will adjust abx dosing whenever warranted.  Temp (24hrs), Avg:98 F (36.7 C), Min:97.5 F (36.4 C), Max:98.3 F (36.8 C)   Recent Labs  Lab 01/13/23 1826 01/13/23 2028  WBC 10.2  --   CREATININE 1.26*  --   LATICACIDVEN  --  0.8    Estimated Creatinine Clearance: 59.7 mL/min (A) (by C-G formula based on SCr of 1.26 mg/dL (H)).    Allergies  Allergen Reactions   Codeine Other (See Comments)    Severe headache.   Sulfa Antibiotics Rash    Rash, blisters, photo sensitivity, and some difficulty breathing    Antimicrobials this admission: 5/2 Zosyn >> x 1 dose 5/3 Ceftriaxone >>  5/2 Vancomycin >>  Microbiology results: 5/02 BCx: Pending  Thank you for allowing pharmacy to be a part of this patient's care.  Otelia Sergeant, PharmD, MBA 01/14/2023 1:26 AM

## 2023-01-14 NOTE — Hospital Course (Signed)
HPI on admission 01/13/23:  "Gabriel Grant is a 62 y.o. male with medical history significant of CAD, tobacco use disorder, HLD, HTN, and more presents to the ED with a chief complaint "jae infection." Patient reports that he had all of his teeth pulled in October for dentures. Since then he has had a bump under his chin. He reports that at first it was small like a pimple. He pointed it out to the dentists/oral surgeons at follow up appointments, but they reportedly told him it was part of the normal healing process. The are has been progressively larger, and has become progressively more firm. On Tuesday it started draining. He reports that the drainage was a milky brown color. He went to his PCP who expressed as much fluid as he could from it. Patient had follow up with his PCP today with a plan to lance the area. They did an xray first that showed signs of osteomyelitis and he was referred to the ER. Patient does report that he was started on Augmentin at the first appointment on Tuesday. He has been taking it as prescribed. He reports no fevers. He does have pain at the area. He describes it as stinging and burning on palpation. Moving his neck makes the pain worse. If there is no palpation, and he holds completely still then he has no pain. Patient has no other complaints at this time. "  Pt was admitted and started on IV antibiotics. Infectious disease consulted.  Further hospital course and management as outlined below.

## 2023-01-14 NOTE — Progress Notes (Signed)
Progress Note   Patient: Gabriel Grant DOB: [birth year] DOA: 01/13/2023     1 DOS: the patient was seen and examined on 01/14/2023   Brief hospital course: HPI on admission 01/13/23:  "Gabriel Grant is a 62 y.o. male with medical history significant of CAD, tobacco use disorder, HLD, HTN, and more presents to the ED with a chief complaint "jae infection." Patient reports that he had all of his teeth pulled in October for dentures. Since then he has had a bump under his chin. He reports that at first it was small like a pimple. He pointed it out to the dentists/oral surgeons at follow up appointments, but they reportedly told him it was part of the normal healing process. The are has been progressively larger, and has become progressively more firm. On Tuesday it started draining. He reports that the drainage was a milky brown color. He went to his PCP who expressed as much fluid as he could from it. Patient had follow up with his PCP today with a plan to lance the area. They did an xray first that showed signs of osteomyelitis and he was referred to the ER. Patient does report that he was started on Augmentin at the first appointment on Tuesday. He has been taking it as prescribed. He reports no fevers. He does have pain at the area. He describes it as stinging and burning on palpation. Moving his neck makes the pain worse. If there is no palpation, and he holds completely still then he has no pain. Patient has no other complaints at this time. "  Pt was admitted and started on IV antibiotics. Infectious disease consulted.  Further hospital course and management as outlined below.   Assessment and Plan: * Osteomyelitis (HCC) Mandible - in setting of oral surgery back on October 2023.  Maxillofacial CT shows osteomyelitis of the mandible with cellulitis and no identifiable abscess. - Given IV vancomycin, Zosyn in ED - Started on empiric vancomycin, Rocephin on admission --Infectious  disease consulted --Abx changed to Unasyn for coverage of strep and oral anearobes --OMFS consult with [facility] is pending - awaiting call back - Blood cultures pending --Monitor closely  Cellulitis of jaw See osteomyelitis  Tobacco use - Counseled on importance of cessation especially in the setting of CAD - Nicotine patch ordered  CAD (coronary artery disease) - Continue aspirin and statin        Subjective: Pt seen in ED, holding for a bed.  He continues to have a lot of lower jaw pain.  Has lost a lot of weight he attributes to inability to eat due to pain.  Has been drinking protein drinks for nutrition.   Physical Exam: Vitals:   01/14/23 1100 01/14/23 1200 01/14/23 1207 01/14/23 1400  BP: 118/71 135/70  118/67  Pulse: 66 (!) 56 61 66  Resp:  17  15  Temp:      TempSrc:      SpO2: 93% 97% 96% 95%  Weight:      Height:       General exam: awake, alert, no acute distress HEENT: tenderness on light palpation of mandible, swelling and tenderness on palpation of neck beneath mandible, moist mucus membranes, hearing grossly normal  Respiratory system: CTAB, no wheezes, rales or rhonchi, normal respiratory effort. Cardiovascular system: normal S1/S2, RRR, no JVD, murmurs, rubs, gallops,  no pedal edema.   Gastrointestinal system: soft, NT, ND, no HSM felt, +bowel sounds. Central nervous system: A&O x 4 .  no gross focal neurologic deficits, normal speech Extremities: moves all, no edema, normal tone Skin: dry, intact, normal temperature Psychiatry: normal mood, congruent affect, judgement and insight appear normal    Data Reviewed:  Notable labs -- glucose 104, BUN 24 otherwise normal CMP, CRP 1.4, normal CBC   Family Communication: None. Pt able to update.  Disposition: Status is: Inpatient Remains inpatient appropriate because: Requires IV antibiotics for osteomyelitis. Awaiting return call from [facility] OMFS service, may require transfer   Planned Discharge  Destination: Home    Time spent: 48 minutes including time at bedside and in coordinating care  Author: Pennie Banter, DO 01/14/2023 3:12 PM  For on call review [email or web address].

## 2023-01-15 DIAGNOSIS — M8618 Other acute osteomyelitis, other site: Secondary | ICD-10-CM | POA: Diagnosis not present

## 2023-01-15 DIAGNOSIS — L03211 Cellulitis of face: Secondary | ICD-10-CM | POA: Diagnosis not present

## 2023-01-15 MED ORDER — FAMOTIDINE 20 MG PO TABS
20.0000 mg | ORAL_TABLET | Freq: Every day | ORAL | Status: DC
  Administered 2023-01-15 – 2023-01-18 (×4): 20 mg via ORAL
  Filled 2023-01-15 (×4): qty 1

## 2023-01-15 MED ORDER — ALUM & MAG HYDROXIDE-SIMETH 200-200-20 MG/5ML PO SUSP: 15.0000 mL | Freq: Four times a day (QID) | ORAL | Status: DC | PRN

## 2023-01-15 MED ORDER — ADULT MULTIVITAMIN W/MINERALS CH
1.0000 | ORAL_TABLET | Freq: Every day | ORAL | Status: DC
  Administered 2023-01-15 – 2023-01-18 (×4): 1 via ORAL
  Filled 2023-01-15 (×4): qty 1

## 2023-01-15 NOTE — Progress Notes (Signed)
Initial Nutrition Assessment  DOCUMENTATION CODES:   Not applicable  INTERVENTION:   -Continue dysphagia 3 diet -Continue Ensure Enlive po TID, each supplement provides 350 kcal and 20 grams of protein -MVI with minerals daily  NUTRITION DIAGNOSIS:   Inadequate oral intake related to other (see comment) (masticatory difficulty) as evidenced by per patient/family report.  GOAL:   Patient will meet greater than or equal to 90% of their needs  MONITOR:   PO intake, Supplement acceptance  REASON FOR ASSESSMENT:   Consult Assessment of nutrition requirement/status, Poor PO  ASSESSMENT:   Pt with medical history significant of CAD, tobacco use disorder, HLD, HTN, and more presents with a chief complaint "jaw infection." Patient reports that he had all of his teeth pulled in October for dentures.  Pt admitted with mandibular osteomyelitis.   Reviewed I/O's: +458 ml x 24 hours   Pt unavailable at time of visit. Attempted to speak with pt via call to hospital room phone, however, unable to reach. RD unable to obtain further nutrition-related history or complete nutrition-focused physical exam at this time.    Per ID notes, pt with mandibular osteomyelitis with associated inflammatory change of the submandibular soft tissue, possibly odontogenic in origin. Plan for ONT consult to evaluate to surgical intervention.   Per chart review, pt has had pain and difficulty chewing secondary to jaw pain. He has been consuming small, bite sized pieces of food. He endorses a 20 pound wt loss over the past 7 months secondary to poor oral intake.   Reviewed wt hx; pt has experienced a 1.7% wt loss over the past 3 months, which is not significant for time frame.   Pt advanced to dysphagia 3 diet today; agree with this diet order to provie widest variety of selections on mechanically altered diet. Pt has also been ordered Ensure supplements and is consuming them per MAR.   Medications reviewed  and include creon.   Labs reviewed.   Diet Order:   Diet Order             DIET DYS 3 Room service appropriate? Yes; Fluid consistency: Thin  Diet effective now                   EDUCATION NEEDS:   No education needs have been identified at this time  Skin:  Skin Assessment: Reviewed RN Assessment  Last BM:  Unknown  Height:   Ht Readings from Last 1 Encounters:  01/13/23 5\' 10"  (1.778 m)    Weight:   Wt Readings from Last 1 Encounters:  01/13/23 68.5 kg    Ideal Body Weight:  75.5 kg  BMI:  Body mass index is 21.67 kg/m.  Estimated Nutritional Needs:   Kcal:  2050-2250  Protein:  105-120 grams  Fluid:  > 2 L    Levada Schilling, RD, LDN, CDCES Registered Dietitian II Certified Diabetes Care and Education Specialist Please refer to [facility] for RD and/or RD on-call/weekend/after hours pager

## 2023-01-15 NOTE — Progress Notes (Signed)
Progress Note   Patient: Gabriel Grant WUJ:[id number] DOB: 11-04-1960 DOA: 01/13/2023     2 DOS: the patient was seen and examined on 01/15/2023   Brief hospital course: HPI on admission 01/13/23:  "Gabriel Grant is a 62 y.o. male with medical history significant of CAD, tobacco use disorder, HLD, HTN, and more presents to the ED with a chief complaint "jae infection." Patient reports that he had all of his teeth pulled in October for dentures. Since then he has had a bump under his chin. He reports that at first it was small like a pimple. He pointed it out to the dentists/oral surgeons at follow up appointments, but they reportedly told him it was part of the normal healing process. The are has been progressively larger, and has become progressively more firm. On Tuesday it started draining. He reports that the drainage was a milky brown color. He went to his PCP who expressed as much fluid as he could from it. Patient had follow up with his PCP today with a plan to lance the area. They did an xray first that showed signs of osteomyelitis and he was referred to the ER. Patient does report that he was started on Augmentin at the first appointment on Tuesday. He has been taking it as prescribed. He reports no fevers. He does have pain at the area. He describes it as stinging and burning on palpation. Moving his neck makes the pain worse. If there is no palpation, and he holds completely still then he has no pain. Patient has no other complaints at this time. "  Pt was admitted and started on IV antibiotics. Infectious disease consulted.  Further hospital course and management as outlined below.   Assessment and Plan: * Osteomyelitis (HCC) Mandible - in setting of oral surgery back on October 2023.  Maxillofacial CT shows osteomyelitis of the mandible with cellulitis and no identifiable abscess. - Given IV vancomycin, Zosyn in ED - Started on empiric vancomycin, Rocephin on admission --Infectious  disease consulted --Abx changed to Unasyn for coverage of strep and oral anearobes --OMFS consult with [facility] is pending - consult case opened 01/14/23.  Called again 01/15/23, told since pt not seen by their service before, need to contact attending.  Page sent, awaiting call back. --Requested our on-call ENT review imaging.  Dr. Willeen Cass agrees pt needs OMFS's input - Blood cultures pending --Monitor closely  Cellulitis of jaw See osteomyelitis  Tobacco use - Counseled on importance of cessation especially in the setting of CAD - Nicotine patch ordered  CAD (coronary artery disease) - Continue aspirin and statin        Subjective: Pt seen sitting up in bed during breakfast this AM.  His jaw pain has improved, still tender to touch but otherwise improved.  Tolerating full liquids and would like to try more regular food today.  No fever/chills or other acute complaints.    Physical Exam: Vitals:   01/14/23 1529 01/14/23 1617 01/14/23 2347 01/15/23 0802  BP:  (!) 148/86 (!) 148/84 (!) 170/85  Pulse:  (!) 58 64 (!) 57  Resp:  18 18 18   Temp: 97.6 F (36.4 C) 97.9 F (36.6 C) 98.4 F (36.9 C) 97.7 F (36.5 C)  TempSrc: Oral     SpO2:  97% 98% 96%  Weight:      Height:       General exam: awake, alert, no acute distress HEENT: less tenderness of mandible, focal tender area below mid chin,  moist mucus membranes, hearing grossly normal  Respiratory system: CTAB, no wheezes, rales or rhonchi, normal respiratory effort. Cardiovascular system: normal S1/S2, RRR, no JVD, murmurs, rubs, gallops,  no pedal edema.   Gastrointestinal system: soft, NT, ND, no HSM felt, +bowel sounds. Central nervous system: A&O x 4 . no gross focal neurologic deficits, normal speech Extremities: moves all, no edema, normal tone Skin: dry, intact, normal temperature Psychiatry: normal mood, congruent affect, judgement and insight appear normal    Data Reviewed:  Notable labs -- glucose 104, BUN 24  otherwise normal CMP, CRP 1.4, normal CBC   Family Communication: None. Pt able to update.  Disposition: Status is: Inpatient Remains inpatient appropriate because: Requires IV antibiotics for osteomyelitis. Still attempting to reach [facility] OMFS service to review case and decide whether any surgical intervention is indicated.    Planned Discharge Destination: Home    Time spent: 46 minutes including time at bedside and in coordinating care  Author: Pennie Banter, DO 01/15/2023 12:38 PM  For on call review [email or web address].

## 2023-01-16 DIAGNOSIS — L03211 Cellulitis of face: Secondary | ICD-10-CM | POA: Diagnosis not present

## 2023-01-16 DIAGNOSIS — M8618 Other acute osteomyelitis, other site: Secondary | ICD-10-CM | POA: Diagnosis not present

## 2023-01-16 LAB — BASIC METABOLIC PANEL
Anion gap: 7 (ref 5–15)
BUN: 21 mg/dL (ref 8–23)
CO2: 26 mmol/L (ref 22–32)
Calcium: 10.4 mg/dL — ABNORMAL HIGH (ref 8.9–10.3)
Chloride: 105 mmol/L (ref 98–111)
Creatinine, Ser: 1.16 mg/dL (ref 0.61–1.24)
GFR, Estimated: >60 mL/min (ref >60)
Glucose, Bld: 113 mg/dL — ABNORMAL HIGH (ref 70–99)
Potassium: 4.8 mmol/L (ref 3.5–5.1)
Sodium: 138 mmol/L (ref 135–145)

## 2023-01-16 LAB — PROCALCITONIN: Procalcitonin: <0.1 ng/mL

## 2023-01-16 NOTE — Progress Notes (Signed)
Progress Note   Patient: Gabriel Grant ZOX:[id number] DOB: 06/29/1961 DOA: 01/13/2023     3 DOS: the patient was seen and examined on 01/16/2023   Brief hospital course: HPI on admission 01/13/23:  "Gabriel Grant is a 62 y.o. male with medical history significant of CAD, tobacco use disorder, HLD, HTN, and more presents to the ED with a chief complaint "jae infection." Patient reports that he had all of his teeth pulled in October for dentures. Since then he has had a bump under his chin. He reports that at first it was small like a pimple. He pointed it out to the dentists/oral surgeons at follow up appointments, but they reportedly told him it was part of the normal healing process. The are has been progressively larger, and has become progressively more firm. On Tuesday it started draining. He reports that the drainage was a milky brown color. He went to his PCP who expressed as much fluid as he could from it. Patient had follow up with his PCP today with a plan to lance the area. They did an xray first that showed signs of osteomyelitis and he was referred to the ER. Patient does report that he was started on Augmentin at the first appointment on Tuesday. He has been taking it as prescribed. He reports no fevers. He does have pain at the area. He describes it as stinging and burning on palpation. Moving his neck makes the pain worse. If there is no palpation, and he holds completely still then he has no pain. Patient has no other complaints at this time. "  Pt was admitted and started on IV antibiotics. Infectious disease consulted.  Further hospital course and management as outlined below.   Assessment and Plan: * Osteomyelitis (HCC) Mandible - in setting of oral surgery back on October 2023.  Maxillofacial CT shows osteomyelitis of the mandible with cellulitis and no identifiable abscess. Clinically Improving on IV antibiotics. - Given IV vancomycin, Zosyn in ED - Started on empiric vancomycin,  Rocephin on admission --Infectious disease consulted --Continue IV Unasyn  --OMFS consult with Dr. Mauri [facility] at [facility]. He reviewed CT images and case details 01/15/23. Recommended continued antibiotics and close outpatient follow up for likely surgery in the future, but no need for acute surgical intervention.  Pt to call his coordinator Shanda Bumps to schedule appointment [phone]) - Blood cultures pending --Monitor closely  Cellulitis of jaw See osteomyelitis  Tobacco use - Counseled on importance of cessation especially in the setting of CAD - Nicotine patch ordered  CAD (coronary artery disease) - Continue aspirin and statin        Subjective: Pt seen sitting up in bed this AM.  Reports further improvement in the swelling of his jaw/neck.  Feels much better.  Tolerating eating and chewing better.  No other acute complaints.  Thankful not to need imminent surgery right now.    Physical Exam: Vitals:   01/15/23 0802 01/15/23 1628 01/15/23 2121 01/16/23 0724  BP: (!) 170/85 (!) 162/78 (!) 146/79 135/83  Pulse: (!) 57 (!) 58 (!) 58 (!) 55  Resp: 18 18 18 16   Temp: 97.7 F (36.5 C) 97.9 F (36.6 C) 98.7 F (37.1 C) 97.7 F (36.5 C)  TempSrc:    Oral  SpO2: 96% 97% 97% 96%  Weight:      Height:       General exam: awake, alert, no acute distress HEENT: improved swelling and tenderness of jaw Respiratory system: on room air, normal  respiratory effort. Cardiovascular system: RRR, no pedal edema.   Central nervous system: A&O x 4 . no gross focal neurologic deficits, normal speech Extremities: moves all, no edema, normal tone Skin: dry, intact, normal temperature Psychiatry: normal mood, congruent affect, judgement and insight appear normal    Data Reviewed:  Notable labs -- glucose 113, Ca mildly elevated 10.4, procal < 0.10   Family Communication: None. Pt able to update.  Disposition: Status is: Inpatient Remains inpatient appropriate because: Requires IV  antibiotics for osteomyelitis.    Planned Discharge Destination: Home    Time spent: 35 minutes  Author: Pennie Banter, DO 01/16/2023 11:26 AM  For on call review [email or web address].

## 2023-01-16 NOTE — Progress Notes (Signed)
ID BRIEF NOTE  01/13/23 blood Cx remain negative, continue Unasyn. OMFS consult pending per primary

## 2023-01-16 NOTE — Plan of Care (Signed)
  Problem: Clinical Measurements: Goal: Ability to maintain clinical measurements within normal limits will improve Outcome: Progressing Goal: Will remain free from infection Outcome: Progressing   Problem: Safety: Goal: Ability to remain free from injury will improve Outcome: Progressing   Problem: Skin Integrity: Goal: Risk for impaired skin integrity will decrease Outcome: Progressing   

## 2023-01-16 NOTE — Plan of Care (Signed)
  Problem: Pain Managment: Goal: General experience of comfort will improve Outcome: Progressing   Problem: Elimination: Goal: Will not experience complications related to bowel motility Outcome: Progressing   Problem: Coping: Goal: Level of anxiety will decrease Outcome: Progressing   Problem: Skin Integrity: Goal: Risk for impaired skin integrity will decrease Outcome: Progressing   Problem: Safety: Goal: Ability to remain free from injury will improve Outcome: Progressing   Problem: Nutrition: Goal: Adequate nutrition will be maintained Outcome: Progressing   Problem: Activity: Goal: Risk for activity intolerance will decrease Outcome: Progressing

## 2023-01-17 ENCOUNTER — Other Ambulatory Visit: Payer: Self-pay

## 2023-01-17 DIAGNOSIS — M272 Inflammatory conditions of jaws: Secondary | ICD-10-CM

## 2023-01-17 DIAGNOSIS — L03211 Cellulitis of face: Secondary | ICD-10-CM | POA: Diagnosis not present

## 2023-01-17 DIAGNOSIS — M8618 Other acute osteomyelitis, other site: Secondary | ICD-10-CM | POA: Diagnosis not present

## 2023-01-17 DIAGNOSIS — E440 Moderate protein-calorie malnutrition: Secondary | ICD-10-CM | POA: Diagnosis present

## 2023-01-17 MED ORDER — CHLORHEXIDINE GLUCONATE CLOTH 2 % EX PADS
6.0000 | MEDICATED_PAD | Freq: Every day | CUTANEOUS | Status: DC
  Administered 2023-01-17 – 2023-01-18 (×2): 6 via TOPICAL

## 2023-01-17 MED ORDER — METRONIDAZOLE 500 MG PO TABS: 500.0000 mg | ORAL_TABLET | Freq: Two times a day (BID) | ORAL | 0 refills | Status: DC

## 2023-01-17 MED ORDER — AMPICILLIN IV (FOR PTA / DISCHARGE USE ONLY)
12.0000 g | INTRAVENOUS | 0 refills | Status: DC
Start: 2023-01-18 — End: 2023-02-22

## 2023-01-17 MED ORDER — SODIUM CHLORIDE 0.9% FLUSH: 10.0000 mL | INTRAVENOUS | Status: DC | PRN

## 2023-01-17 MED ORDER — SODIUM CHLORIDE 0.9% FLUSH
10.0000 mL | Freq: Two times a day (BID) | INTRAVENOUS | Status: DC
  Administered 2023-01-17 – 2023-01-18 (×2): 10 mL

## 2023-01-17 NOTE — Progress Notes (Signed)
Progress Note   Patient: Gabriel Grant WUJ:[id number] DOB: [birth year] DOA: 01/13/2023     4 DOS: the patient was seen and examined on 01/17/2023   Brief hospital course: HPI on admission 01/13/23:  "PEJA HARTTER is a 62 y.o. male with medical history significant of CAD, tobacco use disorder, HLD, HTN, and more presents to the ED with a chief complaint "jae infection." Patient reports that he had all of his teeth pulled in October for dentures. Since then he has had a bump under his chin. He reports that at first it was small like a pimple. He pointed it out to the dentists/oral surgeons at follow up appointments, but they reportedly told him it was part of the normal healing process. The are has been progressively larger, and has become progressively more firm. On Tuesday it started draining. He reports that the drainage was a milky brown color. He went to his PCP who expressed as much fluid as he could from it. Patient had follow up with his PCP today with a plan to lance the area. They did an xray first that showed signs of osteomyelitis and he was referred to the ER. Patient does report that he was started on Augmentin at the first appointment on Tuesday. He has been taking it as prescribed. He reports no fevers. He does have pain at the area. He describes it as stinging and burning on palpation. Moving his neck makes the pain worse. If there is no palpation, and he holds completely still then he has no pain. Patient has no other complaints at this time. "  Pt was admitted and started on IV antibiotics. Infectious disease consulted.  Further hospital course and management as outlined below.   Assessment and Plan: * Osteomyelitis (HCC) Mandible - in setting of oral surgery back on October 2023.  Maxillofacial CT shows osteomyelitis of the mandible with cellulitis and no identifiable abscess. Clinically Improving on IV antibiotics. - Given IV vancomycin, Zosyn in ED - Started on empiric vancomycin,  Rocephin on admission --Infectious disease consulted --Continue IV Unasyn  --OMFS consult with Dr. Mauri Pole at [facility]. He reviewed CT images and case details 01/15/23. Recommended continued antibiotics and close outpatient follow up for likely surgery in the future, but no need for acute surgical intervention.  Pt to call his coordinator Shanda Bumps to schedule appointment [phone]) - Blood cultures pending --Monitor closely  Cellulitis of jaw See osteomyelitis  Tobacco use - Counseled on importance of cessation especially in the setting of CAD - Nicotine patch ordered  CAD (coronary artery disease) - Continue aspirin and statin        Subjective: Pt seen sitting up in bed this AM.  States he feels well, hopes to go home later today.  Swelling and pain continue to improve, much better.  Tolerating meals.  No F/C or other acute complaints.   Physical Exam: Vitals:   01/16/23 0724 01/16/23 1522 01/16/23 2256 01/17/23 0918  BP: 135/83 (!) 154/87 117/81 135/78  Pulse: (!) 55 (!) 58 (!) 57 62  Resp: 16 16 12 18   Temp: 97.7 F (36.5 C) 98.3 F (36.8 C) 98.2 F (36.8 C) 97.8 F (36.6 C)  TempSrc: Oral Oral Oral   SpO2: 96% 100% 97% 98%  Weight:      Height:       General exam: awake, alert, no acute distress HEENT: further improved swelling and tenderness of jaw with focal midline area of residual swelling/nodularity Respiratory system: on room air, normal  respiratory effort. Cardiovascular system: RRR, no pedal edema.   Central nervous system: A&O x 4 . no gross focal neurologic deficits, normal speech Extremities: moves all, no edema, normal tone Skin: dry, intact, normal temperature Psychiatry: normal mood, congruent affect, judgement and insight appear normal    Data Reviewed:  No new labs today   Family Communication: None. Pt able to update.  Disposition: Status is: Inpatient Remains inpatient appropriate because: Requires IV antibiotics for osteomyelitis.    Awaiting PICC line and HH RN set up and education for home IV antibiotics.    Planned Discharge Destination: Home    Time spent: 35 minutes  Author: Pennie Banter, DO 01/17/2023 2:41 PM  For on call review [email or web address].

## 2023-01-17 NOTE — Plan of Care (Signed)

## 2023-01-17 NOTE — Progress Notes (Signed)
Pharmacy - Antimicrobial Stewardship  Patient saw Flossie Buffy in the office 5/2 where fluid collection under chin was aspirated/drained.  It was sent for culture and per office the culture is no growth to date.     Juliette Alcide, PharmD, BCPS, BCIDP Work Cell: [phone] 01/17/2023 2:28 PM

## 2023-01-17 NOTE — Progress Notes (Signed)
[facility]    Date of Admission:  01/13/2023   Total days of antibiotics 5/day 4 amp/sub          ID: Gabriel Grant is a 62 y.o. male with  lower mandibular osteomyelitis 2/2 teeth extraction from Winter 2023 Principal Problem:   Osteomyelitis (HCC) Active Problems:   CAD (coronary artery disease)   Tobacco use   Cellulitis of jaw    Subjective: Afebrile. He reports fluid collection submandibular much improved. Has follow up with outpatient [facility] surgery for further management  Outpatient PCP drained initial fluid collection sent for culture the day prior to admission  Medications:   aspirin EC  81 mg Oral Daily   atorvastatin  40 mg Oral Daily   chlorhexidine  15 mL Mouth/Throat BID   famotidine  20 mg Oral Daily   feeding supplement  237 mL Oral TID BM   heparin  5,000 Units Subcutaneous Q8H   lipase/protease/amylase  72,000 Units Oral TID WC   loratadine  10 mg Oral Daily   multivitamin with minerals  1 tablet Oral Daily   nicotine  21 mg Transdermal Daily   pneumococcal 20-valent conjugate vaccine  0.5 mL Intramuscular Tomorrow-1000    Objective: Vital signs in last 24 hours: Temp:  [97.8 F (36.6 C)-98.3 F (36.8 C)] 97.8 F (36.6 C) (05/06 0918) Pulse Rate:  [57-62] 62 (05/06 0918) Resp:  [12-18] 18 (05/06 0918) BP: (117-154)/(78-87) 135/78 (05/06 0918) SpO2:  [97 %-100 %] 98 % (05/06 1610)  Physical Exam  Constitutional: He is oriented to person, place, and time. He appears well-developed and well-nourished. No distress.  HENT:  Mouth/Throat: Oropharynx is clear and moist. No oropharyngeal exudate.  Submandibular = small area indurated but no fluctuance Cardiovascular: Normal rate, regular rhythm and normal heart sounds. Exam reveals no gallop and no friction rub.  No murmur heard.  Pulmonary/Chest: Effort normal and breath sounds normal. No respiratory distress. He has no wheezes.  Abdominal: Soft. Bowel sounds are normal. He  exhibits no distension. There is no tenderness.  Lymphadenopathy:  He has no cervical adenopathy.  Neurological: He is alert and oriented to person, place, and time.  Skin: Skin is warm and dry. No rash noted. No erythema.  Psychiatric: He has a normal mood and affect. His behavior is normal.    Lab Results Recent Labs    01/16/23 0417  NA 138  K 4.8  CL 105  CO2 26  BUN 21  CREATININE 1.16   Lab Results  Component Value Date   ESRSEDRATE 18 01/14/2023   Lab Results  Component Value Date   CRP 1.4 (H) 01/14/2023    Microbiology: Blood cx ngtd Studies/Results: CT on 01/13/23: IMPRESSION: 1. Inflammatory change of the submandibular soft tissues extending to the skin surface. No abscess or drainable fluid collection. 2. Extensive erosive change of the mandibular body asymmetric to left. This likely indicates chronic or acute on chronic osteomyelitis.  Assessment/Plan: Mandibular osteomyelitis = plan to continue for 6 wk of therapy with IV abtx. Will do continuous infusion amp/sub vs ampicillin plus oral metronidazole 500mg  po bid. 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 Discussed that he is to see surgery in 2-4 wk.  Diagnosis: Jaw osteo  Culture Result: unknown at this time  Allergies  Allergen Reactions  Codeine Other (See Comments)    Severe headache.   Sulfa Antibiotics Rash    Rash, blisters, photo sensitivity, and some difficulty breathing    OPAT Orders Discharge antibiotics to be given via PICC line Discharge antibiotics: Per pharmacy protocol unasyn Aim for Vancomycin trough 15-20 or AUC 400-550 (unless otherwise indicated) Duration: 6 wk End Date: June 13th  [facility] Care Per Protocol:  Home health RN for IV administration and teaching; PICC line care and labs.    Labs weekly while on IV antibiotics: _x_ CBC with  differential _x_ BMP  _x_ CRP x__ ESR   __x Please pull PIC at completion of IV antibiotics   Fax weekly labs to [phone]  Clinic Follow Up Appt: 4 wk with dr Rivka Safer or myself   I have personally spent 52 minutes involved in face-to-face and non-face-to-face activities for this patient on the day of the visit. Professional time spent includes the following activities: Preparing to see the patient (review of tests), Obtaining and/or reviewing separately obtained history (admission/discharge record), Performing a medically appropriate examination and/or evaluation , Ordering medications/tests/procedures, referring and communicating with other health care professionals, Documenting clinical information in the EMR, Independently interpreting results (not separately reported), Communicating results to the patient/family/caregiver, Counseling and educating the patient/family/caregiver and Care coordination (not separately reported).     [facility] Pager: [phone]  01/17/2023, 11:09 AM

## 2023-01-17 NOTE — Progress Notes (Signed)
PHARMACY CONSULT NOTE FOR:  OUTPATIENT  PARENTERAL ANTIBIOTIC THERAPY (OPAT)  Indication: mandibular osteomyelitis Regimen: Ampicillin 12gm IV as continuous infusion over 24hr - also on oral metronidazole 500mg  PO BID End date: 02/24/2023  Labs - Once weekly:  CBC/D, CMP, ESR and CRP Please pull PIC at completion of IV antibiotics Fax weekly labs to [phone]   IV antibiotic discharge orders are pended. To discharging provider:  please sign these orders via discharge navigator,  Select New Orders & click on the button choice - Manage This Unsigned Work.     Thank you for allowing pharmacy to be a part of this patient's care.  Juliette Alcide, PharmD, BCPS, BCIDP Work Cell: [phone] 01/17/2023 3:09 PM

## 2023-01-17 NOTE — Progress Notes (Signed)
Nutrition Follow-up  DOCUMENTATION CODES:   Non-severe (moderate) malnutrition in context of chronic illness  INTERVENTION:   -Continue dysphagia 3 diet -Continue Ensure Enlive po TID, each supplement provides 350 kcal and 20 grams of protein -Continue MVI with minerals daily  NUTRITION DIAGNOSIS:   Moderate Malnutrition related to chronic illness (mandibular osteomeylitis) as evidenced by mild fat depletion, moderate fat depletion, severe muscle depletion, moderate muscle depletion.  Ongoing  GOAL:   Patient will meet greater than or equal to 90% of their needs  Progressing   MONITOR:   PO intake, Supplement acceptance  REASON FOR ASSESSMENT:   Consult Assessment of nutrition requirement/status, Poor PO  ASSESSMENT:   Pt with medical history significant of CAD, tobacco use disorder, HLD, HTN, and more presents with a chief complaint "jaw infection." Patient reports that he had all of his teeth pulled in October for dentures.  Reviewed I/O's: +200 ml x 24 hours and +1.1 L since admission   Spoke with pt at bedside, who was pleasant and in good spirits today. He shares he feels so much better since being admitted. His jaw pain has improved greatly and swelling has gone down a lot per his report. Pt is tolerating a dysphagia 3 diet with thin liquids and tolerating well. Pt reports he chops his food up into bit sized pieces PTA to help with chewing. Noted meal completions 75-100%.   Pt reports decreased oral intake over the past 3 months. He was consuming 2-3 Boost High Protein shakes daily and one meal daily PTA. 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 Per pt, his UBW is around 185# and estimates he has lost about 30# over the past 3 months.   Discussed importance of good meal and supplement intake to promote healing. Pt amenable to continue supplements at home. Pt very appreciative of visit.   Medications reviewed  and include creon.   Labs reviewed.   NUTRITION - FOCUSED PHYSICAL EXAM:  Flowsheet Row Most Recent Value  Orbital Region Mild depletion  Upper Arm Region Moderate depletion  Thoracic and Lumbar Region Moderate depletion  Buccal Region Moderate depletion  Temple Region Moderate depletion  Clavicle Bone Region Severe depletion  Clavicle and Acromion Bone Region Severe depletion  Scapular Bone Region Severe depletion  Dorsal Hand Mild depletion  Patellar Region Moderate depletion  Anterior Thigh Region Moderate depletion  Posterior Calf Region Moderate depletion  Edema (RD Assessment) None  Hair Reviewed  Eyes Reviewed  Mouth Reviewed  Skin Reviewed  Nails Reviewed       Diet Order:   Diet Order             DIET DYS 3 Room service appropriate? Yes; Fluid consistency: Thin  Diet effective now                   EDUCATION NEEDS:   Education needs have been addressed  Skin:  Skin Assessment: Reviewed RN Assessment  Last BM:  01/16/23  Height:   Ht Readings from Last 1 Encounters:  01/13/23 5\' 10"  (1.778 m)    Weight:   Wt Readings from Last 1 Encounters:  01/13/23 68.5 kg    Ideal Body Weight:  75.5 kg  BMI:  Body mass index is 21.67 kg/m.  Estimated Nutritional Needs:   Kcal:  2050-2250  Protein:  105-120 grams  Fluid:  > 2 L    Levada Schilling, RD, LDN, CDCES Registered  Dietitian II Certified Diabetes Care and Education Specialist Please refer to [facility] for RD and/or RD on-call/weekend/after hours pager

## 2023-01-17 NOTE — Plan of Care (Signed)
  Problem: Clinical Measurements: Goal: Ability to maintain clinical measurements within normal limits will improve Outcome: Progressing Goal: Will remain free from infection Outcome: Progressing   Problem: Safety: Goal: Ability to remain free from injury will improve Outcome: Progressing   Problem: Skin Integrity: Goal: Risk for impaired skin integrity will decrease Outcome: Progressing   

## 2023-01-17 NOTE — Progress Notes (Signed)
Peripherally Inserted Central Catheter Placement  The IV Nurse has discussed with the patient and/or persons authorized to consent for the patient, the purpose of this procedure and the potential benefits and risks involved with this procedure.  The benefits include less needle sticks, lab draws from the catheter, and the patient may be discharged home with the catheter. Risks include, but not limited to, infection, bleeding, blood clot (thrombus formation), and puncture of an artery; nerve damage and irregular heartbeat and possibility to perform a PICC exchange if needed/ordered by physician.  Alternatives to this procedure were also discussed.  Bard Power PICC patient education guide, fact sheet on infection prevention and patient information card has been provided to patient /or left at bedside.    PICC Placement Documentation  PICC Single Lumen 01/17/23 Left Basilic 44 cm 0 cm (Active)  Indication for Insertion or Continuance of Line Home intravenous therapies (PICC only) 01/17/23 1846  Exposed Catheter (cm) 0 cm 01/17/23 1846  Site Assessment Clean, Dry, Intact 01/17/23 1846  Line Status Flushed;Blood return noted;Saline locked 01/17/23 1846  Dressing Type Transparent 01/17/23 1846  Dressing Status Antimicrobial disc in place 01/17/23 1846  Safety Lock Not Applicable 01/17/23 1846  Line Care Connections checked and tightened 01/17/23 1846  Line Adjustment (NICU/IV Team Only) No 01/17/23 1846  Dressing Intervention New dressing 01/17/23 1846  Dressing Change Due 01/24/23 01/17/23 1846       Gabriel Grant 01/17/2023, 6:48 PM

## 2023-01-17 NOTE — TOC Progression Note (Signed)
Transition of Care [facility]) - Progression Note    Patient Details  Name: Gabriel Grant MRN: [id number] Date of Birth: [birth year]  Transition of Care [facility]) CM/SW Contact  Marlowe Sax, RN Phone Number: 01/17/2023, 9:27 AM  Clinical Narrative:    Transition of Care [facility]) Screening Note   Patient Details  Name: [name] Date of Birth: [birth year]   Transition of Care [facility]) CM/SW Contact:    Marlowe Sax, RN Phone Number: 01/17/2023, 9:27 AM    Transition of Care Department [facility]) has reviewed patient and no TOC needs have been identified at this time. We will continue to monitor patient advancement through interdisciplinary progression rounds. If new patient transition needs arise, please place a TOC consult.      Expected Discharge Plan: Home/Self Care    Expected Discharge Plan and Services       Living arrangements for the past 2 months: Single Family Home                 DME Arranged: N/A DME Agency: NA       HH Arranged: NA HH Agency: NA         Social Determinants of Health (SDOH) Interventions SDOH Screenings   Food Insecurity: No Food Insecurity (01/16/2023)  Housing: Low Risk  (01/16/2023)  Transportation Needs: No Transportation Needs (01/16/2023)  Utilities: Not At Risk (01/16/2023)  Tobacco Use: High Risk (01/13/2023)    Readmission Risk Interventions     No data to display

## 2023-01-17 NOTE — TOC Progression Note (Signed)
Transition of Care [facility]) - Progression Note    Patient Details  Name: Gabriel Grant MRN: [id number] Date of Birth: 10/15/1960  Transition of Care [facility]) CM/SW Contact  Garret Reddish, RN Phone Number: 01/17/2023, 4:26 PM  Clinical Narrative:  Noted that patient will be a tentative discharge for tomorrow.  I have asked Dr. Denton Lank to sign OPAT orders.    I have asked [name] with [facility] to see if he could accept patient for Home Health nursing.  I have followed up with Pam with [facility].  [facility] will be providing the IV medication for the patient.    Pam, with [facility] reports that patient's wife will be here between 3-4 tomorrow for hospital hookup and teaching.    TOC will continue to follow patient for discharge planning.     Expected Discharge Plan and Services       Living arrangements for the past 2 months: Single Family Home                 DME Arranged: N/A DME Agency: NA       HH Arranged: NA HH Agency: NA         Social Determinants of Health (SDOH) Interventions SDOH Screenings   Food Insecurity: No Food Insecurity (01/16/2023)  Housing: Low Risk  (01/16/2023)  Transportation Needs: No Transportation Needs (01/16/2023)  Utilities: Not At Risk (01/16/2023)  Tobacco Use: High Risk (01/13/2023)    Readmission Risk Interventions     No data to display

## 2023-01-18 DIAGNOSIS — L03211 Cellulitis of face: Secondary | ICD-10-CM | POA: Diagnosis not present

## 2023-01-18 DIAGNOSIS — M8618 Other acute osteomyelitis, other site: Secondary | ICD-10-CM | POA: Diagnosis not present

## 2023-01-18 LAB — CULTURE, BLOOD (ROUTINE X 2)
Culture: NO GROWTH
Culture: NO GROWTH
Special Requests: ADEQUATE

## 2023-01-18 MED ORDER — ENSURE ENLIVE PO LIQD: 237.0000 mL | Freq: Three times a day (TID) | ORAL | 12 refills | Status: AC

## 2023-01-18 MED ORDER — ADULT MULTIVITAMIN W/MINERALS CH: 1.0000 | ORAL_TABLET | Freq: Every day | ORAL | Status: AC

## 2023-01-18 MED ORDER — CHLORHEXIDINE GLUCONATE 0.12 % MT SOLN: 15.0000 mL | Freq: Two times a day (BID) | OROMUCOSAL | 0 refills | Status: DC

## 2023-01-18 NOTE — TOC Progression Note (Signed)
Transition of Care [facility]) - Progression Note    Patient Details  Name: Gabriel Grant MRN: [id number] Date of Birth: Feb 28, 1961  Transition of Care [facility]) CM/SW Contact  Marlowe Sax, RN Phone Number: 01/18/2023, 9:14 AM  Clinical Narrative:    [name] Monday with [name] at [facility]. There will be a copay of 50$ per visit, the patient stated that he can manage that, I notified [name]) The patient reports that he does not need Equipment at home and is independent    Expected Discharge Plan: Home/Self Care    Expected Discharge Plan and Services       Living arrangements for the past 2 months: Single Family Home                 DME Arranged: N/A DME Agency: NA       HH Arranged: NA HH Agency: NA         Social Determinants of Health (SDOH) Interventions SDOH Screenings   Food Insecurity: No Food Insecurity (01/16/2023)  Housing: Low Risk  (01/16/2023)  Transportation Needs: No Transportation Needs (01/16/2023)  Utilities: Not At Risk (01/16/2023)  Tobacco Use: High Risk (01/13/2023)    Readmission Risk Interventions     No data to display

## 2023-01-18 NOTE — Assessment & Plan Note (Signed)
Ensures, multivitamin. Appreciate dietitian recommendations.

## 2023-01-18 NOTE — Discharge Summary (Signed)
Physician Discharge Summary   Patient: Gabriel Grant MRN: [id number] DOB: Sep 09, 1961  Admit date:     01/13/2023  Discharge date: 01/18/2023  Discharge Physician: Pennie Banter   PCP: Olena Leatherwood, FNP   Recommendations at discharge:   Follow up with OMF surgery, Dr. Mauri Pole, at [facility] in 2-4 weeks Follow up with Infectious disease in about 4 weeks Follow up with Primary Care in 1-2 weeks Continue home IV antibiotics per instructions and follow up with home health infusion company  Discharge Diagnoses: Principal Problem:   Osteomyelitis (HCC) Active Problems:   CAD (coronary artery disease)   Tobacco use   Cellulitis of jaw   Malnutrition of moderate degree  Resolved Problems:   * No resolved hospital problems. [facility] Course: HPI on admission 01/13/23:  "Gabriel Grant is a 62 y.o. male with medical history significant of CAD, tobacco use disorder, HLD, HTN, and more presents to the ED with a chief complaint "jae infection." Patient reports that he had all of his teeth pulled in October for dentures. Since then he has had a bump under his chin. He reports that at first it was small like a pimple. He pointed it out to the dentists/oral surgeons at follow up appointments, but they reportedly told him it was part of the normal healing process. The are has been progressively larger, and has become progressively more firm. On Tuesday it started draining. He reports that the drainage was a milky brown color. He went to his PCP who expressed as much fluid as he could from it. Patient had follow up with his PCP today with a plan to lance the area. They did an xray first that showed signs of osteomyelitis and he was referred to the ER. Patient does report that he was started on Augmentin at the first appointment on Tuesday. He has been taking it as prescribed. He reports no fevers. He does have pain at the area. He describes it as stinging and burning on palpation. Moving his neck makes the  pain worse. If there is no palpation, and he holds completely still then he has no pain. Patient has no other complaints at this time. "  Pt was admitted and started on IV antibiotics. Infectious disease consulted.  Further hospital course and management as outlined below.    01/18/23 -- pt doing well.  PICC line placed yesterday evening in left arm.  Home health infusion nurse to do training today.  Pt is medically stable for discharge.   Assessment and Plan: * Osteomyelitis (HCC) Mandible - in setting of oral surgery back on October 2023.  Maxillofacial CT shows osteomyelitis of the mandible with cellulitis and no identifiable abscess. Clinically Improving on IV antibiotics. - Given IV vancomycin, Zosyn in ED - Started on empiric vancomycin, Rocephin on admission --Infectious disease consulted --Transitioned to IV Unasyn  --Left UE PICC line - placed  --Will d/c on IV ampicillin +PO Flagyl --ID recommends 6 wks IV antibiotics --OMFS consult with Dr. Mauri [facility] at [facility]. He reviewed CT images and case details 01/15/23.   Recommended continued antibiotics and close outpatient follow up for likely surgery in the future, but no need for acute surgical intervention.  Pt to call his coordinator Shanda Bumps to schedule appointment [phone]) - Blood cultures pending --Monitor closely  Malnutrition of moderate degree Ensures, multivitamin. Appreciate dietitian recommendations.  Cellulitis of jaw See osteomyelitis  Tobacco use - Counseled on importance of cessation especially in the setting of CAD -  Nicotine patch ordered  CAD (coronary artery disease) - Continue aspirin and statin         Consultants: Infectious Disease Procedures performed: None   Disposition: Home health  Diet recommendation:  Regular diet  DISCHARGE MEDICATION: Allergies as of 01/18/2023       Reactions   Codeine Other (See Comments)   Severe headache.   Sulfa Antibiotics Rash   Rash, blisters, photo  sensitivity, and some difficulty breathing        Medication List     STOP taking these medications    amoxicillin-clavulanate 875-125 MG tablet Commonly known as: AUGMENTIN       TAKE these medications    ampicillin  IVPB Inject 12 g into the vein continuous. Infuse ampicillin 12gm IV as continuous infusion over 24h Indication:  Mandibular osteomyelitis First Dose: Yes Last Day of Therapy:  02/24/2023 Labs - Once weekly:  CBC/D, CMP, ESR and CRP Please pull PIC at completion of IV antibiotics Fax weekly labs to [phone] Method of administration: Ambulatory Pump (Continuous Infusion) Method of administration may be changed at the discretion of home infusion pharmacist based upon assessment of the patient and/or caregiver's ability to self-administer the medication ordered.   aspirin EC 81 MG tablet Take 1 tablet (81 mg total) by mouth daily.   atorvastatin 40 MG tablet Commonly known as: LIPITOR Take 1 tablet by mouth daily.   chlorhexidine 0.12 % solution Commonly known as: PERIDEX Use as directed 15 mLs in the mouth or throat 2 (two) times daily.   diclofenac 75 MG EC tablet Commonly known as: VOLTAREN Take 75 mg by mouth 2 (two) times daily.   feeding supplement Liqd Take 237 mLs by mouth 3 (three) times daily between meals.   lipase/protease/amylase 09811 UNITS Cpep capsule Commonly known as: Creon TAKE 2 CAPSULES BY MOUTH WITH FIRST BITE OF EACH MEAL AND 1 CAPSULE WITH FIRST BITE OF EACH SNACK   loratadine 10 MG tablet Commonly known as: CLARITIN Take 10 mg by mouth daily.   metroNIDAZOLE 500 MG tablet Commonly known as: Flagyl Take 1 tablet (500 mg total) by mouth 2 (two) times daily.   multivitamin with minerals Tabs tablet Take 1 tablet by mouth daily. Start taking on: Jan 19, 2023   NAPROXEN PO Take by mouth as needed.   nitroGLYCERIN 0.4 MG SL tablet Commonly known as: NITROSTAT Place 1 tablet (0.4 mg total) under the tongue every 5  (five) minutes as needed for chest pain.               Discharge Care Instructions  (From admission, onward)           Start     Ordered   01/17/23 0000  Change dressing on IV access line weekly and PRN  (Home infusion instructions - [facility] )        01/17/23 1626            Discharge Exam: Filed Weights   01/13/23 1823  Weight: 68.5 kg   General exam: awake, alert, no acute distress HEENT: atraumatic, clear conjunctiva, anicteric sclera, moist mucus membranes, hearing grossly normal  Respiratory system: CTAB, no wheezes, rales or rhonchi, normal respiratory effort. Cardiovascular system: normal S1/S2, RRR, no JVD, murmurs, rubs, gallops, no pedal edema.   Gastrointestinal system: soft, NT, ND, no HSM felt, +bowel sounds. Central nervous system: A&O x 4. no gross focal neurologic deficits, normal speech Extremities: moves all, no edema, normal tone Skin: dry, intact,  normal temperature, normal color, No rashes, lesions or ulcers Psychiatry: normal mood, congruent affect, judgement and insight appear normal   Condition at discharge: stable  The results of significant diagnostics from this hospitalization (including imaging, microbiology, ancillary and laboratory) are listed below for reference.   Imaging Studies: [place] EKG SITE RITE  Result Date: 01/17/2023 If Site Rite image not attached, placement could not be confirmed due to current cardiac rhythm.  CT Maxillofacial W Contrast  Result Date: 01/13/2023 CLINICAL DATA:  Facial abscess EXAM: CT MAXILLOFACIAL WITH CONTRAST TECHNIQUE: Multidetector CT imaging of the maxillofacial structures was performed with intravenous contrast. Multiplanar CT image reconstructions were also generated. RADIATION DOSE REDUCTION: This exam was performed according to the departmental dose-optimization program which includes automated exposure control, adjustment of the mA and/or kV according to patient size and/or use of  iterative reconstruction technique. CONTRAST:  75mL OMNIPAQUE IOHEXOL 300 MG/ML  SOLN COMPARISON:  None Available. FINDINGS: Osseous: Extensive erosive change of mandibular body asymmetric to left. Edentulous oral cavity. Orbits: Negative. No traumatic or inflammatory finding. Sinuses: Clear. Soft tissues: Inflammatory change of the submandibular soft tissues extending to the skin surface. No abscess or drainable fluid collection. Limited intracranial: No significant or unexpected finding. IMPRESSION: 1. Inflammatory change of the submandibular soft tissues extending to the skin surface. No abscess or drainable fluid collection. 2. Extensive erosive change of the mandibular body asymmetric to left. This likely indicates chronic or acute on chronic osteomyelitis. Electronically Signed   By: Deatra Robinson M.D.   On: 01/13/2023 20:58    Microbiology: Results for orders placed or performed during the hospital encounter of 01/13/23  Culture, blood (routine x 2)     Status: None   Collection Time: 01/13/23  8:28 PM   Specimen: BLOOD  Result Value Ref Range Status   Specimen Description BLOOD BLOOD RIGHT ARM  Final   Special Requests   Final    BOTTLES DRAWN AEROBIC AND ANAEROBIC Blood Culture adequate volume   Culture   Final    NO GROWTH 5 DAYS Performed at [facility], [address] [place], [place] [address]    Report Status 01/18/2023 FINAL  Final  Culture, blood (routine x 2)     Status: None   Collection Time: 01/13/23  8:28 PM   Specimen: BLOOD  Result Value Ref Range Status   Specimen Description BLOOD BLOOD LEFT ARM  Final   Special Requests   Final    BOTTLES DRAWN AEROBIC AND ANAEROBIC Blood Culture results may not be optimal due to an excessive volume of blood received in culture bottles   Culture   Final    NO GROWTH 5 DAYS Performed at [facility], [address], [place], [place] [address]    Report Status 01/18/2023 FINAL  Final  MRSA Next Gen by PCR,  Nasal     Status: None   Collection Time: 01/14/23 12:01 PM   Specimen: Nasal Mucosa; Nasal Swab  Result Value Ref Range Status   MRSA by PCR Next Gen NOT DETECTED NOT DETECTED Final    Comment: (NOTE) The GeneXpert MRSA Assay (FDA approved for NASAL specimens only), is one component of a comprehensive MRSA colonization surveillance program. It is not intended to diagnose MRSA infection nor to guide or monitor treatment for MRSA infections. Test performance is not FDA approved in patients less than 40 years old. Performed at [facility], [address] [place], [place] [address]     Labs: CBC: Recent  Labs  Lab 01/13/23 1826 01/14/23 0557  WBC 10.2 9.2  NEUTROABS  --  5.3  HGB 13.5 13.1  HCT 40.9 39.7  MCV 91.7 92.1  PLT 287 256   Basic Metabolic Panel: Recent Labs  Lab 01/13/23 1826 01/14/23 0557 01/16/23 0417  NA 136 139 138  K 4.4 4.3 4.8  CL 105 108 105  CO2 20* 23 26  GLUCOSE 144* 104* 113*  BUN 30* 24* 21  CREATININE 1.26* 1.06 1.16  CALCIUM 10.2 10.1 10.4*  MG  --  2.1  --    Liver Function Tests: Recent Labs  Lab 01/14/23 0557  AST 22  ALT 15  ALKPHOS 77  BILITOT 0.7  PROT 7.1  ALBUMIN 3.7   CBG: No results for input(s): "GLUCAP" in the last 168 hours.  Discharge time spent: greater than 30 minutes.  Signed: Pennie Banter, DO [facility] 01/18/2023

## 2023-02-08 ENCOUNTER — Encounter: Payer: Self-pay | Admitting: Infectious Diseases

## 2023-02-08 ENCOUNTER — Ambulatory Visit: Payer: Federal, State, Local not specified - PPO | Attending: Infectious Diseases | Admitting: Infectious Diseases

## 2023-02-08 VITALS — BP 113/67 | HR 80 | Temp 98.4°F | Wt 154.0 lb

## 2023-02-08 DIAGNOSIS — Z9049 Acquired absence of other specified parts of digestive tract: Secondary | ICD-10-CM | POA: Insufficient documentation

## 2023-02-08 DIAGNOSIS — I10 Essential (primary) hypertension: Secondary | ICD-10-CM | POA: Diagnosis not present

## 2023-02-08 DIAGNOSIS — M272 Inflammatory conditions of jaws: Secondary | ICD-10-CM | POA: Insufficient documentation

## 2023-02-08 DIAGNOSIS — I2510 Atherosclerotic heart disease of native coronary artery without angina pectoris: Secondary | ICD-10-CM | POA: Diagnosis not present

## 2023-02-08 DIAGNOSIS — E785 Hyperlipidemia, unspecified: Secondary | ICD-10-CM | POA: Diagnosis not present

## 2023-02-08 DIAGNOSIS — M8618 Other acute osteomyelitis, other site: Secondary | ICD-10-CM | POA: Diagnosis not present

## 2023-02-08 DIAGNOSIS — M869 Osteomyelitis, unspecified: Secondary | ICD-10-CM

## 2023-02-08 MED ORDER — HYDROXYZINE HCL 25 MG PO TABS: 25.0000 mg | ORAL_TABLET | Freq: Every day | ORAL | 0 refills | Status: AC

## 2023-02-08 NOTE — Progress Notes (Signed)
NAME: Gabriel Grant  DOB: 03/30/1961  MRN: [id number]  Date/Time: 02/08/2023 7:24 PM   Subjective:   ?pt here for follow up after recent hospitalization at [facility] on 01/13/23-5/2/24for acute VS chronic osteomyelitis of the mandible Gabriel Grant is a 62 y.o. with a history of CAD, Htn, HLD had removal of his teeth by [facility] in oct 2023- they had removed part of his bone then- He was given dentures He noted some swelling under the chin and was told it was part of healing process- This started to get bigger and was painful and his pcp had squeezed it with milky brown discharge. HE had taken augmentin as OP As no significant improvement came to the hospital CT scan done on 01/13/23 showed Inflammatory change of the submandibular soft tissues extending to the skin surface. No abscess or drainable fluid collection.2. Extensive erosive change of the mandibular body asymmetric to left. This likely indicates chronic or acute on chronic osteomyelitis He was seen by ID physcians of [facility] and was discharge don Iv ampicillin continuous infusion with pO flagyl for 6 weeks to be given until 02/25/23. He was asked to follow up with OMFS He is here to see me. He is doing much better He has iching over his arms and legs sun exposed area- says same thing happened when he took sulfa He is a Financial planner No fever or chills The submental swelling has resolved Heis 100% adherent with his meds     Past Medical History:  Diagnosis Date   Acute MI (HCC) 11/14/2016   CAD (coronary artery disease)    Hyperlipidemia    Hypertension     Past Surgical History:  Procedure Laterality Date   CARDIAC CATHETERIZATION     CHOLECYSTECTOMY  2020   COLONOSCOPY WITH PROPOFOL N/A 06/23/2021   Procedure: COLONOSCOPY WITH PROPOFOL;  Surgeon: Toney Reil, MD;  Location: [facility] ENDOSCOPY;  Service: Gastroenterology;  Laterality: N/A;   COLONOSCOPY WITH PROPOFOL N/A 11/30/2022   Procedure: COLONOSCOPY WITH  PROPOFOL;  Surgeon: Toney Reil, MD;  Location: [facility] ENDOSCOPY;  Service: Gastroenterology;  Laterality: N/A;   CORONARY STENT INTERVENTION Right 11/15/2016   Procedure: Coronary Angiogram;  Surgeon: Laurier Nancy, MD;  Location: [facility] INVASIVE CV LAB;  Service: Cardiovascular;  Laterality: Right;   CORONARY STENT INTERVENTION N/A 11/15/2016   Procedure: Coronary Stent Intervention;  Surgeon: Alwyn Pea, MD;  Location: [facility] INVASIVE CV LAB;  Service: Cardiovascular;  Laterality: N/A;   ESOPHAGOGASTRODUODENOSCOPY N/A 06/23/2021   Procedure: ESOPHAGOGASTRODUODENOSCOPY (EGD);  Surgeon: Toney Reil, MD;  Location: [facility] ENDOSCOPY;  Service: Gastroenterology;  Laterality: N/A;   LEFT HEART CATH AND CORONARY ANGIOGRAPHY Right 11/15/2016   Procedure: Left Heart Cath;  Surgeon: Laurier Nancy, MD;  Location: [facility] INVASIVE CV LAB;  Service: Cardiovascular;  Laterality: Right;   TONSILLECTOMY      Social History   Socioeconomic History   Marital status: Married    Spouse name: Not on file   Number of children: Not on file   Years of education: Not on file   Highest education level: Not on file  Occupational History   Not on file  Tobacco Use   Smoking status: Every Day    Packs/day: .5    Types: Cigarettes   Smokeless tobacco: Never   Tobacco comments:    trying to quit  Vaping Use   Vaping Use: Never used  Substance and Sexual Activity   Alcohol use: No  Drug use: No   Sexual activity: Not on file  Other Topics Concern   Not on file  Social History Narrative   Not on file   Social Determinants of Health   Financial Resource Strain: Not on file  Food Insecurity: No Food Insecurity (01/16/2023)   Hunger Vital Sign    Worried About Running Out of Food in the Last Year: Never true    Ran Out of Food in the Last Year: Never true  Transportation Needs: No Transportation Needs (01/16/2023)   PRAPARE - Administrator, Civil Service (Medical): No    Lack  of Transportation (Non-Medical): No  Physical Activity: Not on file  Stress: Not on file  Social Connections: Not on file  Intimate Partner Violence: Not At Risk (01/16/2023)   Humiliation, Afraid, Rape, and Kick questionnaire    Fear of Current or Ex-Partner: No    Emotionally Abused: No    Physically Abused: No    Sexually Abused: No    Family History  Problem Relation Age of Onset   Cancer Mother        breast   Hyperlipidemia Mother    Hypertension Father    CAD Father    CAD Paternal Grandfather    Allergies  Allergen Reactions   Codeine Other (See Comments)    Severe headache.   Sulfa Antibiotics Rash    Rash, blisters, photo sensitivity, and some difficulty breathing   I? Current Outpatient Medications  Medication Sig Dispense Refill   ampicillin IVPB Inject 12 g into the vein continuous. Infuse ampicillin 12gm IV as continuous infusion over 24h Indication:  Mandibular osteomyelitis First Dose: Yes Last Day of Therapy:  02/24/2023 Labs - Once weekly:  CBC/D, CMP, ESR and CRP Please pull PIC at completion of IV antibiotics Fax weekly labs to [phone] Method of administration: Ambulatory Pump (Continuous Infusion) Method of administration may be changed at the discretion of home infusion pharmacist based upon assessment of the patient and/or caregiver's ability to self-administer the medication ordered. 38 Units 0   atorvastatin (LIPITOR) 40 MG tablet Take 1 tablet by mouth daily.     hydrOXYzine (ATARAX) 25 MG tablet Take 1 tablet (25 mg total) by mouth daily. 15 tablet 0   metroNIDAZOLE (FLAGYL) 500 MG tablet Take 1 tablet (500 mg total) by mouth 2 (two) times daily. 74 tablet 0   aspirin EC 81 MG EC tablet Take 1 tablet (81 mg total) by mouth daily. (Patient not taking: Reported on 02/08/2023) 30 tablet 0   chlorhexidine (PERIDEX) 0.12 % solution Use as directed 15 mLs in the mouth or throat 2 (two) times daily. (Patient not taking: Reported on 02/08/2023) 118 mL 0    diclofenac (VOLTAREN) 75 MG EC tablet Take 75 mg by mouth 2 (two) times daily. (Patient not taking: Reported on 02/08/2023)     feeding supplement (ENSURE ENLIVE / ENSURE PLUS) LIQD Take 237 mLs by mouth 3 (three) times daily between meals. (Patient not taking: Reported on 02/08/2023) 237 mL 12   lipase/protease/amylase (CREON) 36000 UNITS CPEP capsule TAKE 2 CAPSULES BY MOUTH WITH FIRST BITE OF EACH MEAL AND 1 CAPSULE WITH FIRST BITE OF [facility] (Patient not taking: Reported on 02/08/2023) 240 capsule 12   Multiple Vitamin (MULTIVITAMIN WITH MINERALS) TABS tablet Take 1 tablet by mouth daily. (Patient not taking: Reported on 02/08/2023)     NAPROXEN PO Take by mouth as needed. (Patient not taking: Reported on 02/08/2023)  nitroGLYCERIN (NITROSTAT) 0.4 MG SL tablet Place 1 tablet (0.4 mg total) under the tongue every 5 (five) minutes as needed for chest pain. (Patient not taking: Reported on 02/08/2023) 30 tablet 0   No current facility-administered medications for this visit.     Abtx:  Anti-infectives (From admission, onward)    None       REVIEW OF SYSTEMS:  Const: negative fever, negative chills, negative weight loss Eyes: negative diplopia or visual changes, negative eye pain ENT: negative coryza, negative sore throat Resp: negative cough, hemoptysis, dyspnea Cards: negative for chest pain, palpitations, lower extremity edema GU: negative for frequency, dysuria and hematuria GI: Negative for abdominal pain, diarrhea, bleeding, constipation Skin: as above Heme: negative for easy bruising and gum/nose bleeding MS: negative for myalgias, arthralgias, back pain and muscle weakness Neurolo:negative for headaches, dizziness, vertigo, memory problems  Psych: negative for feelings of anxiety, depression  Endocrine: negative for thyroid, diabetes Allergy/Immunology- sulfa Objective:  VITALS:  BP 113/67   Pulse 80   Temp 98.4 F (36.9 C) (Oral)   Wt 154 lb (69.9 kg)   SpO2 96%    BMI 22.10 kg/m  LDA Left PICC PHYSICAL EXAM:  General: Alert, cooperative, no distress, appears stated age.  Head: Normocephalic, without obvious abnormality, atraumatic. Eyes: Conjunctivae clear, anicteric sclerae. Pupils are equal ENT Nares normal. No drainage or sinus tenderness. Lips, mucosa, and tongue normal. No Thrush Full set of dentures Neck: Supple, symmetrical, no adenopathy, thyroid: non tender no carotid bruit and no JVD. Back: No CVA tenderness. Lungs: Clear to auscultation bilaterally. No Wheezing or Rhonchi. No rales. Heart: Regular rate and rhythm, no murmur, rub or gallop. Abdomen: Soft, non-tender,not distended. Bowel sounds normal. No masses Extremities: atraumatic, no cyanosis. No edema. No clubbing Skin:mild erythema over arms and legs Some sun damaged skin Lymph: Cervical, supraclavicular normal. Neurologic: Grossly non-focal Pertinent Labs none  IMAGING RESULTS: Erosive changes left side of mandible ? Impression/Recommendation Acute on chronic osteomyelitis of the mandible following teeth extraction oct 2023 He now has only dentures He had a swelling and discharge May 1st week and is doing so much better on Iv ampicillin and flagyl which treats oral organism slike streptococcus , actino and anerobes He will complete 6 weeks of IV on 6/14 and following which will do high dose augmentin for 6 more weeks I asked him to follow up [facility] for OMFS referral  Photosensitivity over arms and legs- to use SPF 60 Will prescribe atarax to be taken at nigh for itching May change to loratidine after a few days Warned about drowsiness  ?follow up visit on 02/22/23 Will get labs from home health ? ? ___________________________________________________ Discussed with patient in detail Note:  This document was prepared using Dragon voice recognition software and may include unintentional dictation errors.

## 2023-02-08 NOTE — Patient Instructions (Addendum)
You are here for  mandible osteomyelitis and you are on IV ampicillin You are also taking flagyl.until 02/24/23 and then you would go on pill augmentin  You had teeth extraction and bone removal by [facility] last October 2023-  You now have dentures Please follow up with them to see a oro maxillary surgeon  You have a sun induced rash on arms and legs- please use SPF 60 cream and will send a medication atarax  l

## 2023-02-22 ENCOUNTER — Ambulatory Visit: Payer: Federal, State, Local not specified - PPO | Attending: Infectious Diseases | Admitting: Infectious Diseases

## 2023-02-22 DIAGNOSIS — Z792 Long term (current) use of antibiotics: Secondary | ICD-10-CM | POA: Insufficient documentation

## 2023-02-22 DIAGNOSIS — M272 Inflammatory conditions of jaws: Secondary | ICD-10-CM | POA: Diagnosis not present

## 2023-02-22 DIAGNOSIS — Z95828 Presence of other vascular implants and grafts: Secondary | ICD-10-CM | POA: Diagnosis not present

## 2023-02-22 MED ORDER — AMOXICILLIN-POT CLAVULANATE ER 1000-62.5 MG PO TB12: 2.0000 | ORAL_TABLET | Freq: Two times a day (BID) | ORAL | 1 refills | Status: DC

## 2023-02-22 NOTE — Progress Notes (Signed)
The purpose of this virtual visit is to provide medical care while limiting exposure to the novel coronavirus (COVID19) for both patient and office staff.   Consent was obtained for phone visit:  Yes.   Answered questions that patient had about telehealth interaction:  Yes.   I discussed the limitations, risks, security and privacy concerns of performing an evaluation and management service by telephone. I also discussed with the patient that there may be a patient responsible charge related to this service. The patient expressed understanding and agreed to proceed.   Patient Location: Home Provider Location:  

## 2023-02-24 ENCOUNTER — Telehealth: Payer: Self-pay

## 2023-02-24 NOTE — Telephone Encounter (Signed)
Per Dr. Rivka Safer patient's picc line can be removed tomorrow and he should start the high dose Augmentin.  Patient advised orders given to [facility] regarding picc line removal and he confirmed that he has picked up the Augmentin. Lashann Hagg T Pricilla Loveless

## 2023-03-15 ENCOUNTER — Telehealth: Payer: Self-pay

## 2023-03-15 NOTE — Telephone Encounter (Signed)
Received a call from patient stating that he was having issues with Augmentin 1000/62.5 2 tablets bid, it was causing him to have diarrhea and vomiting. It started on 6/24 and he stopped taking it on 6/28. He stated that the diarrhea has slowed up some and he has not been vomiting.   Per Dr. Rivka Safer have patient take 1 tablet bid and if diarrhea gets worse to let her know and she will prescribe him something else.

## 2024-01-10 ENCOUNTER — Ambulatory Visit: Attending: Cardiology | Admitting: Cardiology

## 2024-01-10 ENCOUNTER — Encounter: Payer: Self-pay | Admitting: Cardiology

## 2024-01-10 VITALS — BP 144/76 | HR 53 | Ht 70.0 in | Wt 159.6 lb

## 2024-01-10 DIAGNOSIS — R072 Precordial pain: Secondary | ICD-10-CM | POA: Diagnosis not present

## 2024-01-10 DIAGNOSIS — I2510 Atherosclerotic heart disease of native coronary artery without angina pectoris: Secondary | ICD-10-CM

## 2024-01-10 DIAGNOSIS — I10 Essential (primary) hypertension: Secondary | ICD-10-CM | POA: Diagnosis not present

## 2024-01-10 DIAGNOSIS — R0609 Other forms of dyspnea: Secondary | ICD-10-CM

## 2024-01-10 DIAGNOSIS — F17200 Nicotine dependence, unspecified, uncomplicated: Secondary | ICD-10-CM

## 2024-01-10 MED ORDER — ISOSORBIDE MONONITRATE ER 30 MG PO TB24: 15.0000 mg | ORAL_TABLET | Freq: Every day | ORAL | 3 refills | Status: AC

## 2024-01-10 NOTE — Progress Notes (Signed)
 Cardiology Office Note:    Date:  01/10/2024   ID:  Gabriel Grant, DOB 1961-09-13, MRN [id number]  PCP:  Mai Schwalbe, FNP   [facility] Providers Cardiologist:  Constancia Delton, MD     Referring MD: Mai Schwalbe, FNP   Chief Complaint  Patient presents with   Follow-up    12 month follow up visit. Patient states that he has some chest pain every once in a while. Patient states that he notices shortness of breath when exerts himself. Meds reviewed.     History of Present Illness:    Gabriel Grant is a 63 y.o. male with a hx of CAD/PCI to dRCA 2018, hypertension, current smoker x40+ years who presents for follow-up.    States having chest pain with exertion relieved with rest.  Also endorses shortness of breath with exertion.  He still smokes, has cut back to half a pack per day.  Describes fluctuating blood pressures, systolic ranging between 110-140s.  He is compliant with his medications as prescribed.  Prior notes Echo 02/2021 EF 60 to 65% Lexiscan  Myoview  01/2021, low risk study, no ischemia Abdominal CT 10/2020 possible interstitial lung disease superimposed on emphysema.   Past Medical History:  Diagnosis Date   Acute MI (HCC) 11/14/2016   CAD (coronary artery disease)    Hyperlipidemia    Hypertension     Past Surgical History:  Procedure Laterality Date   CARDIAC CATHETERIZATION     CHOLECYSTECTOMY  2020   COLONOSCOPY WITH PROPOFOL  N/A 06/23/2021   Procedure: COLONOSCOPY WITH PROPOFOL ;  Surgeon: Selena Daily, MD;  Location: [facility] ENDOSCOPY;  Service: Gastroenterology;  Laterality: N/A;   COLONOSCOPY WITH PROPOFOL  N/A 11/30/2022   Procedure: COLONOSCOPY WITH PROPOFOL ;  Surgeon: Selena Daily, MD;  Location: [facility] ENDOSCOPY;  Service: Gastroenterology;  Laterality: N/A;   CORONARY STENT INTERVENTION Right 11/15/2016   Procedure: Coronary Angiogram;  Surgeon: Cherrie Cornwall, MD;  Location: [facility] INVASIVE CV LAB;  Service: Cardiovascular;   Laterality: Right;   CORONARY STENT INTERVENTION N/A 11/15/2016   Procedure: Coronary Stent Intervention;  Surgeon: Antonette Batters, MD;  Location: [facility] INVASIVE CV LAB;  Service: Cardiovascular;  Laterality: N/A;   ESOPHAGOGASTRODUODENOSCOPY N/A 06/23/2021   Procedure: ESOPHAGOGASTRODUODENOSCOPY (EGD);  Surgeon: Selena Daily, MD;  Location: [facility] ENDOSCOPY;  Service: Gastroenterology;  Laterality: N/A;   LEFT HEART CATH AND CORONARY ANGIOGRAPHY Right 11/15/2016   Procedure: Left Heart Cath;  Surgeon: Cherrie Cornwall, MD;  Location: [facility] INVASIVE CV LAB;  Service: Cardiovascular;  Laterality: Right;   TONSILLECTOMY      Current Medications: Current Meds  Medication Sig   aspirin  EC 81 MG EC tablet Take 1 tablet (81 mg total) by mouth daily.   atorvastatin  (LIPITOR) 40 MG tablet Take 1 tablet by mouth daily.   diphenhydrAMINE -APAP, sleep, (TYLENOL  PM EXTRA STRENGTH PO) Take 1 tablet by mouth at bedtime.   isosorbide mononitrate (IMDUR) 30 MG 24 hr tablet Take 0.5 tablets (15 mg total) by mouth daily.   lipase/protease/amylase (CREON ) 36000 UNITS CPEP capsule TAKE 2 CAPSULES BY MOUTH WITH FIRST BITE OF EACH MEAL AND 1 CAPSULE WITH FIRST BITE OF EACH SNACK   Loratadine  (CLARITIN  PO) Take 1 tablet by mouth daily.   Multiple Vitamin (MULTIVITAMIN WITH MINERALS) TABS tablet Take 1 tablet by mouth daily.     Allergies:   Codeine and Sulfa antibiotics   Social History   Socioeconomic History   Marital status: Married    Spouse name: Not  on file   Number of children: Not on file   Years of education: Not on file   Highest education level: Not on file  Occupational History   Not on file  Tobacco Use   Smoking status: Every Day    Current packs/day: 0.50    Types: Cigarettes   Smokeless tobacco: Never   Tobacco comments:    trying to quit  Vaping Use   Vaping status: Never Used  Substance and Sexual Activity   Alcohol use: No   Drug use: No   Sexual activity: Not on file   Other Topics Concern   Not on file  Social History Narrative   Not on file   Social Drivers of Health   Financial Resource Strain: Low Risk  (01/11/2023)   Received from [facility], [facility]   Overall Financial Resource Strain (CARDIA)    Difficulty of Paying Living Expenses: Not very hard  Food Insecurity: No Food Insecurity (01/16/2023)   Hunger Vital Sign    Worried About Running Out of Food in the Last Year: Never true    Ran Out of Food in the Last Year: Never true  Transportation Needs: No Transportation Needs (01/16/2023)   PRAPARE - Administrator, Civil Service (Medical): No    Lack of Transportation (Non-Medical): No  Physical Activity: Not on file  Stress: Not on file  Social Connections: Not on file     Family History: The patient's family history includes CAD in his father and paternal grandfather; Cancer in his mother; Hyperlipidemia in his mother; Hypertension in his father.  ROS:   Please see the history of present illness.     All other systems reviewed and are negative.  EKGs/Labs/Other Studies Reviewed:    The following studies were reviewed today:   EKG:  EKG is  ordered today.  The ekg ordered today demonstrates sinus bradycardia, heart rate 53  Recent Labs: 01/14/2023: ALT 15; Hemoglobin 13.1; Magnesium 2.1; Platelets 256 01/16/2023: BUN 21; Creatinine, Ser 1.16; Potassium 4.8; Sodium 138  Recent Lipid Panel    Component Value Date/Time   CHOL 180 11/14/2016 1513   TRIG 117 11/14/2016 1513   HDL 52 11/14/2016 1513   CHOLHDL 3.5 11/14/2016 1513   VLDL 23 11/14/2016 1513   LDLCALC 105 (H) 11/14/2016 1513   Lipid panel 8/23 total cholesterol 121, triglycerides 158, HDL 44, LDL 45.  Risk Assessment/Calculations:      Physical Exam:    VS:  BP (!) 144/76   Pulse (!) 53   Ht 5\' 10"  (1.778 m)   Wt 159 lb 9.6 oz (72.4 kg)   SpO2 97%   BMI 22.90 kg/m     Wt Readings from Last 3 Encounters:  01/10/24 159 lb 9.6 oz (72.4  kg)  02/08/23 154 lb (69.9 kg)  01/13/23 151 lb (68.5 kg)     GEN:  Well nourished, well developed in no acute distress HEENT: Normal NECK: No JVD; No carotid bruits CARDIAC: Bradycardic, regular, no murmurs, rubs, gallops RESPIRATORY:  Clear to auscultation without rales, wheezing or rhonchi  ABDOMEN: Soft, non-tender, non-distended MUSCULOSKELETAL:  No edema; No deformity  SKIN: Warm and dry NEUROLOGIC:  Alert and oriented x 3 PSYCHIATRIC:  Normal affect   ASSESSMENT:    1. Precordial pain   2. Coronary artery disease involving native coronary artery of native heart, unspecified whether angina present   3. Dyspnea on exertion   4. Primary hypertension   5.  Current smoker     PLAN:    In order of problems listed above:  Chest pain, obtain Lexiscan  Myoview .  Previous echo with normal EF 60 to 65%.  Start Imdur 15 mg daily. CAD/PCI to RCA 2018, endorses chest pain.  Imdur, Lexiscan  Myoview  as above.  Continue aspirin , Lipitor 40 mg daily.  Dyspnea on exertion, cardiac workup with Myoview  as above.  Patient has a history of emphysema, possible ILD.  Referred to pulmonary medicine. Hypertension, start Imdur 15 mg daily.  For both antianginal and BP benefit.   Current smoker, cessation again advised.  Follow-up in 6 weeks.  Informed Consent   Shared Decision Making/Informed Consent The risks [chest pain, shortness of breath, cardiac arrhythmias, dizziness, blood pressure fluctuations, myocardial infarction, stroke/transient ischemic attack, nausea, vomiting, allergic reaction, radiation exposure, metallic taste sensation and life-threatening complications (estimated to be 1 in 10,000)], benefits (risk stratification, diagnosing coronary artery disease, treatment guidance) and alternatives of a nuclear stress test were discussed in detail with Mr. Godette and he agrees to proceed.      Medication Adjustments/Labs and Tests Ordered: Current medicines are reviewed at length with  the patient today.  Concerns regarding medicines are outlined above.  Orders Placed This Encounter  Procedures   NM Myocar Multi W/Spect W/Wall Motion / EF   Ambulatory referral to Pulmonology   EKG 12-Lead    Meds ordered this encounter  Medications   isosorbide mononitrate (IMDUR) 30 MG 24 hr tablet    Sig: Take 0.5 tablets (15 mg total) by mouth daily.    Dispense:  60 tablet    Refill:  3    Pill splitter.     Patient Instructions  Medication Instructions:  Your physician recommends the following medication changes.  START TAKING: Imdur 15 mg (half tablet) daily  *If you need a refill on your cardiac medications before your next appointment, please call your pharmacy*  Lab Work: No labs ordered today.    Testing/Procedures: Your provider has ordered a Lexiscan / Exercise Myoview  Stress test. This will take place at [facility]. Please report to the [facility] medical mall entrance. The volunteers at the first desk will direct you where to go.   [facility] MYOVIEW   Your provider has ordered a Stress Test with nuclear imaging. The purpose of this test is to evaluate the blood supply to your heart muscle. This procedure is referred to as a "Non-Invasive Stress Test." This is because other than having an IV started in your vein, nothing is inserted or "invades" your body. Cardiac stress tests are done to find areas of poor blood flow to the heart by determining the extent of coronary artery disease (CAD). Some patients exercise on a treadmill, which naturally increases the blood flow to your heart, while others who are unable to walk on a treadmill due to physical limitations will have a pharmacologic/chemical stress agent called Lexiscan  . This medicine will mimic walking on a treadmill by temporarily increasing your coronary blood flow.   Please note: these test may take anywhere between 2-4 hours to complete  How to prepare for your Myoview  test:  Nothing to eat for 6 hours prior to the  test No caffeine for 24 hours prior to test No smoking 24 hours prior to test. Your medication may be taken with water .  If your doctor stopped a medication because of this test, do not take that medication. Ladies, please do not wear dresses.  Skirts or pants are appropriate. Please wear a short sleeve  shirt. No perfume, cologne or lotion. Wear comfortable walking shoes. No heels!   PLEASE NOTIFY THE OFFICE AT LEAST 24 HOURS IN ADVANCE IF YOU ARE UNABLE TO KEEP YOUR APPOINTMENT.  [phone] AND  PLEASE NOTIFY NUCLEAR MEDICINE AT [facility] AT LEAST 24 HOURS IN ADVANCE IF YOU ARE UNABLE TO KEEP YOUR APPOINTMENT. [phone]   Follow-Up: At [facility], you and your health needs are our priority.  As part of our continuing mission to provide you with exceptional heart care, our providers are all part of one team.  This team includes your primary Cardiologist (physician) and Advanced Practice Providers or APPs (Physician Assistants and Nurse Practitioners) who all work together to provide you with the care you need, when you need it.  Your next appointment:   6 week(s)  Provider:   You may see Constancia Delton, MD or one of the following Advanced Practice Providers on your designated Care Team:   Laneta Pintos, NP Gildardo Labrador, PA-C Varney Gentleman, PA-C Cadence Pottsgrove, PA-C Ronald Cockayne, NP Morey Ar, NP    We recommend signing up for the patient portal called "MyChart".  Sign up information is provided on this After Visit Summary.  MyChart is used to connect with patients for Virtual Visits (Telemedicine).  Patients are able to view lab/test results, encounter notes, upcoming appointments, etc.  Non-urgent messages can be sent to your provider as well.   To learn more about what you can do with MyChart, go to [email or web address].          Signed, Constancia Delton, MD  01/10/2024 9:38 AM    [facility]

## 2024-01-10 NOTE — Patient Instructions (Signed)
 Medication Instructions:  Your physician recommends the following medication changes.  START TAKING: Imdur 15 mg (half tablet) daily  *If you need a refill on your cardiac medications before your next appointment, please call your pharmacy*  Lab Work: No labs ordered today.    Testing/Procedures: Your provider has ordered a Lexiscan / Exercise Myoview  Stress test. This will take place at [facility]. Please report to the [facility] medical mall entrance. The volunteers at the first desk will direct you where to go.   [facility] MYOVIEW   Your provider has ordered a Stress Test with nuclear imaging. The purpose of this test is to evaluate the blood supply to your heart muscle. This procedure is referred to as a "Non-Invasive Stress Test." This is because other than having an IV started in your vein, nothing is inserted or "invades" your body. Cardiac stress tests are done to find areas of poor blood flow to the heart by determining the extent of coronary artery disease (CAD). Some patients exercise on a treadmill, which naturally increases the blood flow to your heart, while others who are unable to walk on a treadmill due to physical limitations will have a pharmacologic/chemical stress agent called Lexiscan  . This medicine will mimic walking on a treadmill by temporarily increasing your coronary blood flow.   Please note: these test may take anywhere between 2-4 hours to complete  How to prepare for your Myoview  test:  Nothing to eat for 6 hours prior to the test No caffeine for 24 hours prior to test No smoking 24 hours prior to test. Your medication may be taken with water .  If your doctor stopped a medication because of this test, do not take that medication. Ladies, please do not wear dresses.  Skirts or pants are appropriate. Please wear a short sleeve shirt. No perfume, cologne or lotion. Wear comfortable walking shoes. No heels!   PLEASE NOTIFY THE OFFICE AT LEAST 24 HOURS IN ADVANCE IF YOU ARE  UNABLE TO KEEP YOUR APPOINTMENT.  [phone] AND  PLEASE NOTIFY NUCLEAR MEDICINE AT [facility] AT LEAST 24 HOURS IN ADVANCE IF YOU ARE UNABLE TO KEEP YOUR APPOINTMENT. [phone]   Follow-Up: At [facility], you and your health needs are our priority.  As part of our continuing mission to provide you with exceptional heart care, our providers are all part of one team.  This team includes your primary Cardiologist (physician) and Advanced Practice Providers or APPs (Physician Assistants and Nurse Practitioners) who all work together to provide you with the care you need, when you need it.  Your next appointment:   6 week(s)  Provider:   You may see Constancia Delton, MD or one of the following Advanced Practice Providers on your designated Care Team:   Laneta Pintos, NP Gildardo Labrador, PA-C Varney Gentleman, PA-C Cadence [place], PA-C Ronald Cockayne, NP Morey Ar, NP    We recommend signing up for the patient portal called "MyChart".  Sign up information is provided on this After Visit Summary.  MyChart is used to connect with patients for Virtual Visits (Telemedicine).  Patients are able to view lab/test results, encounter notes, upcoming appointments, etc.  Non-urgent messages can be sent to your provider as well.   To learn more about what you can do with MyChart, go to [email or web address].

## 2024-01-31 ENCOUNTER — Encounter
Admission: RE | Admit: 2024-01-31 | Discharge: 2024-01-31 | Disposition: A | Discharge status: Home / Self Care | Source: Ambulatory Visit | Attending: Cardiology | Admitting: Cardiology

## 2024-01-31 DIAGNOSIS — F17200 Nicotine dependence, unspecified, uncomplicated: Secondary | ICD-10-CM | POA: Insufficient documentation

## 2024-01-31 DIAGNOSIS — R079 Chest pain, unspecified: Secondary | ICD-10-CM

## 2024-01-31 MED ORDER — REGADENOSON 0.4 MG/5ML IV SOLN
0.4000 mg | Freq: Once | INTRAVENOUS | Status: AC
Start: 2024-01-31 — End: 2024-01-31
  Administered 2024-01-31: 0.4 mg via INTRAVENOUS

## 2024-01-31 MED ORDER — TECHNETIUM TC 99M TETROFOSMIN IV KIT
32.9200 | PACK | Freq: Once | INTRAVENOUS | Status: AC | PRN
  Administered 2024-01-31: 32.92 via INTRAVENOUS

## 2024-01-31 MED ORDER — TECHNETIUM TC 99M TETROFOSMIN IV KIT
10.6900 | PACK | Freq: Once | INTRAVENOUS | Status: AC | PRN
  Administered 2024-01-31: 10.69 via INTRAVENOUS

## 2024-02-01 ENCOUNTER — Ambulatory Visit: Payer: Self-pay | Admitting: Cardiology

## 2024-02-01 LAB — NM MYOCAR MULTI W/SPECT W/WALL MOTION / EF
LV dias vol: 76 mL (ref 62–150)
LV sys vol: 21 mL
MPHR: 158 {beats}/min
Nuc Stress EF: 72 %
Peak HR: 87 {beats}/min
Percent HR: 55 %
Rest HR: 58 {beats}/min
Rest Nuclear Isotope Dose: 10.7 mCi
SDS: 0
SRS: 2
SSS: 0
ST Depression (mm): 0 mm
Stress Nuclear Isotope Dose: 32.9 mCi
TID: 0.84

## 2024-02-14 ENCOUNTER — Other Ambulatory Visit
Admission: RE | Admit: 2024-02-14 | Discharge: 2024-02-14 | Disposition: A | Discharge status: Home / Self Care | Source: Home / Self Care | Attending: Pulmonary Disease | Admitting: Pulmonary Disease

## 2024-02-14 ENCOUNTER — Encounter: Payer: Self-pay | Admitting: Pulmonary Disease

## 2024-02-14 ENCOUNTER — Ambulatory Visit
Admission: RE | Admit: 2024-02-14 | Discharge: 2024-02-14 | Disposition: A | Discharge status: Home / Self Care | Attending: Pulmonary Disease | Admitting: Pulmonary Disease

## 2024-02-14 ENCOUNTER — Ambulatory Visit
Admission: RE | Admit: 2024-02-14 | Discharge: 2024-02-14 | Disposition: A | Discharge status: Home / Self Care | Source: Ambulatory Visit | Attending: Pulmonary Disease | Admitting: Pulmonary Disease

## 2024-02-14 ENCOUNTER — Ambulatory Visit: Admitting: Pulmonary Disease

## 2024-02-14 VITALS — BP 120/68 | HR 59 | Temp 97.3°F | Ht 70.0 in | Wt 160.2 lb

## 2024-02-14 DIAGNOSIS — F17210 Nicotine dependence, cigarettes, uncomplicated: Secondary | ICD-10-CM

## 2024-02-14 DIAGNOSIS — R0609 Other forms of dyspnea: Secondary | ICD-10-CM | POA: Insufficient documentation

## 2024-02-14 DIAGNOSIS — J449 Chronic obstructive pulmonary disease, unspecified: Secondary | ICD-10-CM

## 2024-02-14 DIAGNOSIS — J439 Emphysema, unspecified: Secondary | ICD-10-CM | POA: Diagnosis not present

## 2024-02-14 DIAGNOSIS — J8410 Pulmonary fibrosis, unspecified: Secondary | ICD-10-CM

## 2024-02-14 MED ORDER — ALBUTEROL SULFATE HFA 108 (90 BASE) MCG/ACT IN AERS: 2.0000 | INHALATION_SPRAY | Freq: Four times a day (QID) | RESPIRATORY_TRACT | 2 refills | Status: DC | PRN

## 2024-02-14 NOTE — Patient Instructions (Signed)
 VISIT SUMMARY:  Today, you were seen for shortness of breath during physical activities, which is accompanied by chest pain. You have a significant smoking history and have been experiencing wheezing and cough. You were referred by your cardiologist for further evaluation of your symptoms.  YOUR PLAN:  -EMPHYSEMA: Emphysema is a lung condition that causes shortness of breath due to damage to the air sacs in the lungs, often related to long-term smoking. We will order a chest x-ray to assess the extent of emphysema and check your blood for hereditary causes. You will be enrolled in a lung cancer screening program. An albuterol  inhaler has been prescribed for you to use, 2 puffs up to 4 times a day as needed for shortness of breath. It is crucial to stop smoking to prevent further lung damage.  -DYSPNEA ON EXERTION: Dyspnea on exertion means experiencing shortness of breath during physical activities. This is likely related to your emphysema. We will conduct pulmonary function tests to assess your lung function and a walking test to monitor your oxygen levels during exertion. The results of your upcoming stress test with the cardiologist will also help determine if there are any cardiac causes.  INSTRUCTIONS:  Please follow up with your cardiologist next week as scheduled. Additionally, make sure to get the chest x-ray and blood tests done as ordered. Use the albuterol  inhaler as prescribed and try to quit smoking. We will also schedule the pulmonary function tests and walking test for you.

## 2024-02-14 NOTE — Progress Notes (Addendum)
 Subjective:    Patient ID: Gabriel Grant, male    DOB: July 27, 1961, 63 y.o.   MRN: [id number]  Patient Care Team: Camelia Sherwood BIRCH, FNP as PCP - General (Nurse Practitioner) Darliss Rogue, MD as PCP - Cardiology (Cardiology)  Chief Complaint  Patient presents with   Consult    DOE. Wheezing at times. Cough with clear.     BACKGROUND: Patient is a 63 year old current smoker who presents for evaluation of dyspnea particularly dyspnea on exertion.  He is kindly referred by Dr. Rogue Darliss.   HPI Discussed the use of AI scribe software for clinical note transcription with the patient, who gave verbal consent to proceed.  History of Present Illness   Gabriel Grant is a 63 year old male who presents with dyspnea on exertion. He was referred by a cardiologist for evaluation of his symptoms.  He experiences shortness of breath during physical activities such as pulling or pushing objects uphill, which worsens after stopping the activity and walking back to his car. This is accompanied by chest pain. He has a significant smoking history, currently smoking 4 to 6 cigarettes a day, having reduced from a previous range of half a pack to a full pack daily. He acknowledges difficulty in quitting smoking entirely.  Three weeks ago, he visited a cardiologist who scheduled a stress test. The cardiologist was unsure whether his symptoms were related to his heart or lungs. He is scheduled to follow up with the cardiologist next week.  He experiences wheezing and cough but has never used an inhaler. He has been informed in the past that he may have COPD. His last chest x-ray was in 2022.  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  No military history.    Reviewed the lung windows from his recent pharmacologic myocardial perfusion stress test and the lung windows reveal emphysema and possibly some early fibrotic changes.   Review of  Systems A 10 point review of systems was performed and it is as noted above otherwise negative.   Past Medical History:  Diagnosis Date   Acute MI (HCC) 11/14/2016   CAD (coronary artery disease)    Hyperlipidemia    Hypertension     Past Surgical History:  Procedure Laterality Date   CARDIAC CATHETERIZATION     CHOLECYSTECTOMY  2020   COLONOSCOPY WITH PROPOFOL  N/A 06/23/2021   Procedure: COLONOSCOPY WITH PROPOFOL ;  Surgeon: Unk Corinn Skiff, MD;  Location: [facility] ENDOSCOPY;  Service: Gastroenterology;  Laterality: N/A;   COLONOSCOPY WITH PROPOFOL  N/A 11/30/2022   Procedure: COLONOSCOPY WITH PROPOFOL ;  Surgeon: Unk Corinn Skiff, MD;  Location: [facility] ENDOSCOPY;  Service: Gastroenterology;  Laterality: N/A;   CORONARY STENT INTERVENTION Right 11/15/2016   Procedure: Coronary Angiogram;  Surgeon: Denyse DELENA Bathe, MD;  Location: [facility] INVASIVE CV LAB;  Service: Cardiovascular;  Laterality: Right;   CORONARY STENT INTERVENTION N/A 11/15/2016   Procedure: Coronary Stent Intervention;  Surgeon: Cara BIRCH Lovelace, MD;  Location: [facility] INVASIVE CV LAB;  Service: Cardiovascular;  Laterality: N/A;   ESOPHAGOGASTRODUODENOSCOPY N/A 06/23/2021   Procedure: ESOPHAGOGASTRODUODENOSCOPY (EGD);  Surgeon: Unk Corinn Skiff, MD;  Location: [facility] ENDOSCOPY;  Service: Gastroenterology;  Laterality: N/A;   LEFT HEART CATH AND CORONARY ANGIOGRAPHY Right 11/15/2016   Procedure: Left Heart Cath;  Surgeon: Denyse DELENA Bathe, MD;  Location: [facility] INVASIVE CV LAB;  Service: Cardiovascular;  Laterality: Right;   TONSILLECTOMY      Patient Active Problem List  Diagnosis Date Noted   Malnutrition of moderate degree 01/17/2023   Osteomyelitis (HCC) 01/13/2023   Tobacco use 01/13/2023   Cellulitis of jaw 01/13/2023   History of colonic polyps 11/30/2022   Polyp of sigmoid colon    Abdominal pain, chronic, epigastric    Exocrine pancreatic insufficiency 06/09/2021   Calculus of gallbladder without cholecystitis  without obstruction 01/23/2019   CAD (coronary artery disease) 01/20/2017   Essential hypertension 01/20/2017   H/O heart artery stent 01/20/2017   NSTEMI (non-ST elevated myocardial infarction) (HCC) 11/14/2016    Family History  Problem Relation Age of Onset   Cancer Mother        breast   Hyperlipidemia Mother    Hypertension Father    CAD Father    CAD Paternal Grandfather     Social History   Tobacco Use   Smoking status: Every Day    Current packs/day: 0.50    Types: Cigarettes   Smokeless tobacco: Never   Tobacco comments:    Smokes 5-6 cigarettes a day- khj 02/14/2024        Started smoking at 18 years.    Smoked 2 PPD at his heaviest.  Substance Use Topics   Alcohol use: No    Allergies  Allergen Reactions   Codeine Other (See Comments)    Severe headache.   Sulfa Antibiotics Rash    Rash, blisters, photo sensitivity, and some difficulty breathing    Current Meds  Medication Sig   albuterol  (VENTOLIN  HFA) 108 (90 Base) MCG/ACT inhaler Inhale 2 puffs into the lungs every 6 (six) hours as needed for wheezing or shortness of breath.   aspirin  EC 81 MG EC tablet Take 1 tablet (81 mg total) by mouth daily.   atorvastatin  (LIPITOR) 40 MG tablet Take 1 tablet by mouth daily.   diphenhydrAMINE -APAP, sleep, (TYLENOL  PM EXTRA STRENGTH PO) Take 1 tablet by mouth at bedtime.   isosorbide  mononitrate (IMDUR ) 30 MG 24 hr tablet Take 0.5 tablets (15 mg total) by mouth daily.   lipase/protease/amylase (CREON ) 36000 UNITS CPEP capsule TAKE 2 CAPSULES BY MOUTH WITH FIRST BITE OF EACH MEAL AND 1 CAPSULE WITH FIRST BITE OF EACH SNACK   Loratadine  (CLARITIN  PO) Take 1 tablet by mouth daily.   Multiple Vitamin (MULTIVITAMIN WITH MINERALS) TABS tablet Take 1 tablet by mouth daily.    Immunization History  Administered Date(s) Administered   Pneumococcal Polysaccharide-23 10/26/2018   Tdap 02/28/2016        Objective:     BP 120/68 (BP Location: Right Arm, Cuff Size:  Normal)   Pulse (!) 59   Temp (!) 97.3 F (36.3 C)   Ht 5' 10 (1.778 m)   Wt 160 lb 3.2 oz (72.7 kg)   SpO2 97%   BMI 22.99 kg/m   SpO2: 97 % O2 Device: None (Room air)  GENERAL: Well-developed, well-nourished gentleman, no acute distress.  Fully ambulatory.  No conversational dyspnea. HEAD: Normocephalic, atraumatic.  EYES: Pupils equal, round, reactive to light.  No scleral icterus.  MOUTH: There is poor dentition with some missing teeth.  No thrush.  Oral mucosa moist. NECK: Supple. No thyromegaly. Trachea midline. No JVD.  No adenopathy. PULMONARY: Good air entry bilaterally.  Coarse with no other adventitious sounds. CARDIOVASCULAR: S1 and S2. Regular rate and rhythm.  No rubs, murmurs or gallops heard. ABDOMEN: Benign. MUSCULOSKELETAL: No joint deformity, no clubbing, no edema.  NEUROLOGIC: No overt focal deficit, no gait disturbance, speech is fluent. SKIN: Intact,warm,dry. PSYCH: Mood and  behavior normal.  Ambulatory oxymetry was performed today:  At rest on room air oxygen saturation was 96%, the patient ambulated at a brisk pace, completed 3 laps, O2 nadir 95%, mild shortness of breath.  Resting heart rate was 61 bpm at maximum for this exercise 82 bpm.  Study did not show significant oxygen desaturations with exercise.       Assessment & Plan:     ICD-10-CM   1. Dyspnea on exertion  R06.09 DG Chest 2 View    Pulmonary function test    CANCELED: Pulmonary function test    2. COPD suggested by initial evaluation (HCC)  J44.9 Pulmonary function test    CANCELED: Pulmonary function test    3. Pulmonary emphysema, unspecified emphysema type (HCC)  J43.9 Alpha-1 antitrypsin phenotype    Pulmonary function test    CANCELED: Pulmonary function test    4. Tobacco dependence due to cigarettes  F17.210 Ambulatory Referral for Lung Cancer Scre     Orders Placed This Encounter  Procedures   DG Chest 2 View    Standing Status:   Future    Number of Occurrences:   1     Expected Date:   02/14/2024    Expiration Date:   02/13/2025    Reason for Exam (SYMPTOM  OR DIAGNOSIS REQUIRED):   Dyspnea, chest pain    Preferred imaging location?:   [facility]   Alpha-1 antitrypsin phenotype    Standing Status:   Future    Expiration Date:   02/13/2025   Ambulatory Referral for Lung Cancer Scre    Referral Priority:   Routine    Referral Type:   Consultation    Referral Reason:   Specialty Services Required    Number of Visits Requested:   1   Pulmonary function test    Standing Status:   Future    Expected Date:   02/28/2024    Expiration Date:   02/13/2025    Where should this test be performed?:   Outpatient Pulmonary    What type of PFT is being ordered?:   Full PFT   Meds ordered this encounter  Medications   albuterol  (VENTOLIN  HFA) 108 (90 Base) MCG/ACT inhaler    Sig: Inhale 2 puffs into the lungs every 6 (six) hours as needed for wheezing or shortness of breath.    Dispense:  8 g    Refill:  2   Discussion:    Emphysema Significant emphysema evident on lung imaging, with emphysematous changes likely exacerbated by over 40 years of smoking. Potential hereditary component to be evaluated. - Order chest x-ray to assess emphysema extent - Check blood test for hereditary causes of emphysema - Enroll in lung cancer screening program - Prescribe albuterol  inhaler, 2 puffs up to 4 times a day as needed for dyspnea - Advise smoking cessation  Dyspnea on exertion Dyspnea on exertion associated with significant emphysema. Oxygen levels remain stable during exertion, indicating no immediate hypoxemia. Differential includes cardiac causes, pending stress test results from cardiologist. - Order pulmonary function tests to assess lung function - Conduct walking test to monitor oxygen levels during exertion: no desats noted    Advised if symptoms do not improve or worsen, to please contact office for sooner follow up or seek emergency care.    I spent 60  minutes of dedicated to the care of this patient on the date of this encounter to include pre-visit review of records, face-to-face time with the patient discussing conditions  above, post visit ordering of testing, clinical documentation with the electronic health record, making appropriate referrals as documented, and communicating necessary findings to members of the patients care team.   C. Leita Sanders, MD Advanced Bronchoscopy PCCM [facility]    *This note was dictated using voice recognition software/Dragon.  Despite best efforts to proofread, errors can occur which can change the meaning. Any transcriptional errors that result from this process are unintentional and may not be fully corrected at the time of dictation.

## 2024-02-20 LAB — ALPHA-1-ANTITRYPSIN PHENOTYP: A-1 Antitrypsin, Ser: 150 mg/dL (ref 101–187)

## 2024-02-21 ENCOUNTER — Encounter: Payer: Self-pay | Admitting: Cardiology

## 2024-02-21 ENCOUNTER — Ambulatory Visit: Attending: Cardiology | Admitting: Cardiology

## 2024-02-21 VITALS — BP 100/54 | HR 58 | Ht 70.0 in | Wt 160.1 lb

## 2024-02-21 DIAGNOSIS — R0609 Other forms of dyspnea: Secondary | ICD-10-CM

## 2024-02-21 DIAGNOSIS — I2510 Atherosclerotic heart disease of native coronary artery without angina pectoris: Secondary | ICD-10-CM | POA: Diagnosis not present

## 2024-02-21 DIAGNOSIS — F17200 Nicotine dependence, unspecified, uncomplicated: Secondary | ICD-10-CM | POA: Diagnosis not present

## 2024-02-21 NOTE — Progress Notes (Signed)
 Cardiology Office Note:    Date:  02/21/2024   ID:  Gabriel Grant, DOB [birth year], MRN [id number]  PCP:  Mai Schwalbe, FNP   [facility] Providers Cardiologist:  Constancia Delton, MD     Referring MD: Mai Schwalbe, FNP   Chief Complaint  Patient presents with   6 week follow up     Discuss Myoview  results. Patient c/o shortness of breath; is being seen by a pulmonologist with testing.     History of Present Illness:    Gabriel Grant is a 63 y.o. male with a hx of CAD/PCI to dRCA 2018, hypertension, current smoker x40+ years who presents for follow-up.    Previously seen due to symptoms of chest pain and shortness of breath with exertion.  Lexiscan  Myoview  was obtained to evaluate any significant perfusion abnormalities.  Isosorbide  mononitrate 50 mg daily was started.  His symptoms of chest pain have improved with taking Imdur .  Has followed up with pulmonary medicine, lung function testing being planned.  Started on rescue inhaler which has helped somewhat with his breathing.  He has also cut back on smoking which he believes has also help with his breathing.     Prior notes Echo 02/2021 EF 60 to 65% Lexiscan  Myoview  01/2021, low risk study, no ischemia Abdominal CT 10/2020 possible interstitial lung disease superimposed on emphysema.   Past Medical History:  Diagnosis Date   Acute MI (HCC) 11/14/2016   CAD (coronary artery disease)    Hyperlipidemia    Hypertension     Past Surgical History:  Procedure Laterality Date   CARDIAC CATHETERIZATION     CHOLECYSTECTOMY  2020   COLONOSCOPY WITH PROPOFOL  N/A 06/23/2021   Procedure: COLONOSCOPY WITH PROPOFOL ;  Surgeon: Selena Daily, MD;  Location: [facility] ENDOSCOPY;  Service: Gastroenterology;  Laterality: N/A;   COLONOSCOPY WITH PROPOFOL  N/A 11/30/2022   Procedure: COLONOSCOPY WITH PROPOFOL ;  Surgeon: Selena Daily, MD;  Location: [facility] ENDOSCOPY;  Service: Gastroenterology;  Laterality: N/A;   CORONARY  STENT INTERVENTION Right 11/15/2016   Procedure: Coronary Angiogram;  Surgeon: Cherrie Cornwall, MD;  Location: [facility] INVASIVE CV LAB;  Service: Cardiovascular;  Laterality: Right;   CORONARY STENT INTERVENTION N/A 11/15/2016   Procedure: Coronary Stent Intervention;  Surgeon: Antonette Batters, MD;  Location: [facility] INVASIVE CV LAB;  Service: Cardiovascular;  Laterality: N/A;   ESOPHAGOGASTRODUODENOSCOPY N/A 06/23/2021   Procedure: ESOPHAGOGASTRODUODENOSCOPY (EGD);  Surgeon: Selena Daily, MD;  Location: [facility] ENDOSCOPY;  Service: Gastroenterology;  Laterality: N/A;   LEFT HEART CATH AND CORONARY ANGIOGRAPHY Right 11/15/2016   Procedure: Left Heart Cath;  Surgeon: Cherrie Cornwall, MD;  Location: [facility] INVASIVE CV LAB;  Service: Cardiovascular;  Laterality: Right;   TONSILLECTOMY      Current Medications: Current Meds  Medication Sig   albuterol  (VENTOLIN  HFA) 108 (90 Base) MCG/ACT inhaler Inhale 2 puffs into the lungs every 6 (six) hours as needed for wheezing or shortness of breath.   aspirin  EC 81 MG EC tablet Take 1 tablet (81 mg total) by mouth daily.   atorvastatin  (LIPITOR) 40 MG tablet Take 1 tablet by mouth daily.   diphenhydrAMINE -APAP, sleep, (TYLENOL  PM EXTRA STRENGTH PO) Take 1 tablet by mouth at bedtime.   feeding supplement (ENSURE ENLIVE / ENSURE PLUS) LIQD Take 237 mLs by mouth 3 (three) times daily between meals.   isosorbide  mononitrate (IMDUR ) 30 MG 24 hr tablet Take 0.5 tablets (15 mg total) by mouth daily.   lipase/protease/amylase (CREON ) 36000 UNITS  CPEP capsule TAKE 2 CAPSULES BY MOUTH WITH FIRST BITE OF EACH MEAL AND 1 CAPSULE WITH FIRST BITE OF EACH SNACK   Loratadine  (CLARITIN  PO) Take 1 tablet by mouth daily.   Multiple Vitamin (MULTIVITAMIN WITH MINERALS) TABS tablet Take 1 tablet by mouth daily.   NAPROXEN  PO Take by mouth as needed.   nitroGLYCERIN  (NITROSTAT ) 0.4 MG SL tablet Place 1 tablet (0.4 mg total) under the tongue every 5 (five) minutes as needed for  chest pain.     Allergies:   Codeine and Sulfa antibiotics   Social History   Socioeconomic History   Marital status: Married    Spouse name: Not on file   Number of children: Not on file   Years of education: Not on file   Highest education level: Not on file  Occupational History   Not on file  Tobacco Use   Smoking status: Every Day    Current packs/day: 0.50    Types: Cigarettes   Smokeless tobacco: Never   Tobacco comments:    Smokes 5-6 cigarettes a day- khj 02/14/2024        Started smoking at 18 years.    Smoked 2 PPD at his heaviest.  Vaping Use   Vaping status: Never Used  Substance and Sexual Activity   Alcohol use: No   Drug use: No   Sexual activity: Not on file  Other Topics Concern   Not on file  Social History Narrative   Not on file   Social Drivers of Health   Financial Resource Strain: Low Risk  (01/11/2023)   Received from [facility]   Overall Financial Resource Strain (CARDIA)    Difficulty of Paying Living Expenses: Not very hard  Food Insecurity: No Food Insecurity (01/16/2023)   Hunger Vital Sign    Worried About Running Out of Food in the Last Year: Never true    Ran Out of Food in the Last Year: Never true  Transportation Needs: No Transportation Needs (01/16/2023)   PRAPARE - Administrator, Civil Service (Medical): No    Lack of Transportation (Non-Medical): No  Physical Activity: Not on file  Stress: Not on file  Social Connections: Not on file     Family History: The patient's family history includes CAD in his father and paternal grandfather; Cancer in his mother; Hyperlipidemia in his mother; Hypertension in his father.  ROS:   Please see the history of present illness.     All other systems reviewed and are negative.  EKGs/Labs/Other Studies Reviewed:    The following studies were reviewed today:   Recent Labs: No results found for requested labs within last 365 days.  Recent Lipid Panel     Component Value Date/Time   CHOL 180 11/14/2016 1513   TRIG 117 11/14/2016 1513   HDL 52 11/14/2016 1513   CHOLHDL 3.5 11/14/2016 1513   VLDL 23 11/14/2016 1513   LDLCALC 105 (H) 11/14/2016 1513   Lipid panel 8/23 total cholesterol 121, triglycerides 158, HDL 44, LDL 45.  Risk Assessment/Calculations:      Physical Exam:    VS:  BP (!) 100/54 (BP Location: Left Arm, Patient Position: Sitting, Cuff Size: Normal)   Pulse (!) 58   Ht 5\' 10"  (1.778 m)   Wt 160 lb 2 oz (72.6 kg)   SpO2 98%   BMI 22.98 kg/m     Wt Readings from Last 3 Encounters:  02/21/24 160 lb 2 oz (  72.6 kg)  02/14/24 160 lb 3.2 oz (72.7 kg)  01/10/24 159 lb 9.6 oz (72.4 kg)     GEN:  Well nourished, well developed in no acute distress HEENT: Normal NECK: No JVD; No carotid bruits CARDIAC: Bradycardic, regular, no murmurs, rubs, gallops RESPIRATORY:  Clear to auscultation without rales, wheezing or rhonchi  ABDOMEN: Soft, non-tender, non-distended MUSCULOSKELETAL:  No edema; No deformity  SKIN: Warm and dry NEUROLOGIC:  Alert and oriented x 3 PSYCHIATRIC:  Normal affect   ASSESSMENT:    1. Coronary artery disease involving native coronary artery of native heart, unspecified whether angina present   2. Dyspnea on exertion   3. Current smoker    PLAN:    In order of problems listed above:  CAD/PCI to RCA 2018, chest pain controlled with Imdur .  Lexiscan  Myoview  01/2024 showed no ischemia, low risk study.  EF 65%.  Echo 6/22 EF 60 to 65%.  Continue Imdur  15 mg daily, aspirin  81 mg daily, Lipitor 40 mg daily.  Dyspnea on exertion, likely from emphysema.  Workup as per pulmonary medicine.   Current smoker, cessation again advised.  Follow-up in 12 months      Medication Adjustments/Labs and Tests Ordered: Current medicines are reviewed at length with the patient today.  Concerns regarding medicines are outlined above.  No orders of the defined types were placed in this encounter.   No orders of  the defined types were placed in this encounter.    Patient Instructions  Medication Instructions:  Your physician recommends that you continue on your current medications as directed. Please refer to the Current Medication list given to you today.   *If you need a refill on your cardiac medications before your next appointment, please call your pharmacy*  Lab Work: No labs ordered today  If you have labs (blood work) drawn today and your tests are completely normal, you will receive your results only by: MyChart Message (if you have MyChart) OR A paper copy in the mail If you have any lab test that is abnormal or we need to change your treatment, we will call you to review the results.  Testing/Procedures: No test ordered today   Follow-Up: At [facility], you and your health needs are our priority.  As part of our continuing mission to provide you with exceptional heart care, our providers are all part of one team.  This team includes your primary Cardiologist (physician) and Advanced Practice Providers or APPs (Physician Assistants and Nurse Practitioners) who all work together to provide you with the care you need, when you need it.  Your next appointment:   1 year(s)  Provider:   You may see Constancia Delton, MD or one of the following Advanced Practice Providers on your designated Care Team:   Laneta Pintos, NP Gildardo Labrador, PA-C Varney Gentleman, PA-C Cadence [place], PA-C Ronald Cockayne, NP Morey Ar, NP    We recommend signing up for the patient portal called "MyChart".  Sign up information is provided on this After Visit Summary.  MyChart is used to connect with patients for Virtual Visits (Telemedicine).  Patients are able to view lab/test results, encounter notes, upcoming appointments, etc.  Non-urgent messages can be sent to your provider as well.   To learn more about what you can do with MyChart, go to [email or web address].      Signed, Constancia Delton, MD  02/21/2024 9:31 AM    [facility]

## 2024-02-21 NOTE — Patient Instructions (Signed)

## 2024-02-23 ENCOUNTER — Ambulatory Visit: Payer: Self-pay | Admitting: Pulmonary Disease

## 2024-02-23 DIAGNOSIS — J849 Interstitial pulmonary disease, unspecified: Secondary | ICD-10-CM

## 2024-02-27 NOTE — Progress Notes (Signed)
 [facility] or check MyChart for result note or portal message.

## 2024-02-29 NOTE — Telephone Encounter (Signed)
 I have left a message asking the patient to call me back to schedule his CT

## 2024-03-01 NOTE — Telephone Encounter (Signed)
 I have spoke with Gabriel Grant and his CT has been scheduled on 03/13/24 @ 4:00pm at [facility] and he is aware

## 2024-03-02 ENCOUNTER — Ambulatory Visit: Payer: Self-pay | Admitting: Pulmonary Disease

## 2024-03-13 ENCOUNTER — Ambulatory Visit
Admission: RE | Admit: 2024-03-13 | Discharge: 2024-03-13 | Disposition: A | Discharge status: Home / Self Care | Source: Ambulatory Visit | Attending: Pulmonary Disease | Admitting: Pulmonary Disease

## 2024-03-13 DIAGNOSIS — J849 Interstitial pulmonary disease, unspecified: Secondary | ICD-10-CM | POA: Diagnosis present

## 2024-03-22 ENCOUNTER — Ambulatory Visit: Payer: Self-pay | Admitting: Pulmonary Disease

## 2024-03-27 ENCOUNTER — Encounter: Payer: Self-pay | Admitting: Pulmonary Disease

## 2024-03-27 ENCOUNTER — Ambulatory Visit: Admitting: Pulmonary Disease

## 2024-03-27 VITALS — BP 126/90 | HR 62 | Temp 98.2°F | Ht 70.0 in | Wt 163.0 lb

## 2024-03-27 DIAGNOSIS — J449 Chronic obstructive pulmonary disease, unspecified: Secondary | ICD-10-CM

## 2024-03-27 DIAGNOSIS — R0609 Other forms of dyspnea: Secondary | ICD-10-CM | POA: Diagnosis not present

## 2024-03-27 DIAGNOSIS — F17210 Nicotine dependence, cigarettes, uncomplicated: Secondary | ICD-10-CM | POA: Diagnosis not present

## 2024-03-27 DIAGNOSIS — J8410 Pulmonary fibrosis, unspecified: Secondary | ICD-10-CM

## 2024-03-27 DIAGNOSIS — J439 Emphysema, unspecified: Secondary | ICD-10-CM

## 2024-03-27 LAB — PULMONARY FUNCTION TEST
DL/VA % pred: 66 %
DL/VA: 2.78 ml/min/mmHg/L
DLCO unc % pred: 52 %
DLCO unc: 14.31 ml/min/mmHg
FEF 25-75 Post: 1.29 L/s
FEF 25-75 Pre: 1.39 L/s
FEF2575-%Change-Post: -6 %
FEF2575-%Pred-Post: 45 %
FEF2575-%Pred-Pre: 48 %
FEV1-%Change-Post: 0 %
FEV1-%Pred-Post: 66 %
FEV1-%Pred-Pre: 66 %
FEV1-Post: 2.36 L
FEV1-Pre: 2.36 L
FEV1FVC-%Change-Post: 0 %
FEV1FVC-%Pred-Pre: 90 %
FEV6-%Change-Post: 0 %
FEV6-%Pred-Post: 75 %
FEV6-%Pred-Pre: 76 %
FEV6-Post: 3.39 L
FEV6-Pre: 3.41 L
FEV6FVC-%Change-Post: 0 %
FEV6FVC-%Pred-Post: 102 %
FEV6FVC-%Pred-Pre: 103 %
FVC-%Change-Post: 0 %
FVC-%Pred-Post: 73 %
FVC-%Pred-Pre: 73 %
FVC-Post: 3.47 L
FVC-Pre: 3.47 L
Post FEV1/FVC ratio: 68 %
Post FEV6/FVC ratio: 98 %
Pre FEV1/FVC ratio: 68 %
Pre FEV6/FVC Ratio: 98 %
RV % pred: 107 %
RV: 2.45 L
TLC % pred: 85 %
TLC: 5.95 L

## 2024-03-27 MED ORDER — TRELEGY ELLIPTA 100-62.5-25 MCG/ACT IN AEPB: 1.0000 | INHALATION_SPRAY | Freq: Every day | RESPIRATORY_TRACT | 11 refills | Status: AC

## 2024-03-27 MED ORDER — TRELEGY ELLIPTA 100-62.5-25 MCG/ACT IN AEPB: 1.0000 | INHALATION_SPRAY | Freq: Every day | RESPIRATORY_TRACT | 0 refills | Status: DC

## 2024-03-27 NOTE — Progress Notes (Signed)
 Full PFT completed today ? ?

## 2024-03-27 NOTE — Patient Instructions (Addendum)
 VISIT SUMMARY:  Today, we discussed your COPD and emphysema, reviewed your recent test results, and talked about your smoking reduction efforts. We also went over a new treatment plan to help manage your symptoms more effectively.  YOUR PLAN:  -MODERATE COPD WITH EMPHYSEMA: COPD, or Chronic Obstructive Pulmonary Disease, is a lung condition that makes it hard to breathe. Emphysema, a type of COPD, involves damage to the air sacs in the lungs. Your lung function is at 66%, and your symptoms get worse with heat and physical activity. We are starting you on a new inhaler called Trelegy, which you should use once daily. I provided samples and showed you how to use it. Remember to rinse your mouth after using the inhaler to prevent any side effects. Continue using your rescue inhaler as needed. I also gave you a coupon to help with the cost. We will follow up in 3-4 months to see how you are doing.  -PULMONARY FIBROSIS DUE TO SMOKING: Pulmonary fibrosis is a condition where lung tissue becomes scarred, making it difficult to breathe. Your CT scan showed some scarring, but it's not as severe as initially thought. It's very important to continue your efforts to quit smoking to prevent further damage. You've done a great job reducing your smoking to less than a quarter pack per day and using nicotine  replacement therapy like Zyn. Keep up the good work.  INSTRUCTIONS:  Please schedule a follow-up appointment in 3-4 months to assess your response to the new treatment. Continue using your rescue inhaler as needed and follow the instructions for the Trelegy inhaler. Keep working on quitting smoking, and let us  know if you need any additional support or resources.

## 2024-03-27 NOTE — Patient Instructions (Signed)
 Full PFT completed today ? ?

## 2024-03-27 NOTE — Progress Notes (Unsigned)
 Subjective:    Patient ID: Gabriel Grant, male    DOB: 1961/06/15, 63 y.o.   MRN: [id number]  Patient Care Team: Camelia Sherwood BIRCH, FNP as PCP - General (Nurse Practitioner) Darliss Rogue, MD as PCP - Cardiology (Cardiology)  Chief Complaint  Patient presents with   Follow-up    BACKGROUND/INTERVAL:Patient is a 63 year old current smoker who presents for follow-up of dyspnea on exertion.   HPI    Review of Systems A 10 point review of systems was performed and it is as noted above otherwise negative.   Patient Active Problem List   Diagnosis Date Noted   Malnutrition of moderate degree 01/17/2023   Osteomyelitis (HCC) 01/13/2023   Tobacco use 01/13/2023   Cellulitis of jaw 01/13/2023   History of colonic polyps 11/30/2022   Polyp of sigmoid colon    Abdominal pain, chronic, epigastric    Exocrine pancreatic insufficiency 06/09/2021   Calculus of gallbladder without cholecystitis without obstruction 01/23/2019   CAD (coronary artery disease) 01/20/2017   Essential hypertension 01/20/2017   H/O heart artery stent 01/20/2017   NSTEMI (non-ST elevated myocardial infarction) (HCC) 11/14/2016    Social History   Tobacco Use   Smoking status: Every Day    Current packs/day: 0.25    Average packs/day: 1.7 packs/day for 45.5 years (79.4 ttl pk-yrs)    Types: Cigarettes    Start date: 1980   Smokeless tobacco: Never   Tobacco comments:    Smokes 5-6 cigarettes a day- khj 02/14/2024        Started smoking at 18 years.    Smoked 2 PPD at his heaviest.  Substance Use Topics   Alcohol use: No    Allergies  Allergen Reactions   Codeine Other (See Comments)    Severe headache.   Sulfa Antibiotics Rash    Rash, blisters, photo sensitivity, and some difficulty breathing    Current Meds  Medication Sig   albuterol  (VENTOLIN  HFA) 108 (90 Base) MCG/ACT inhaler Inhale 2 puffs into the lungs every 6 (six) hours as needed for wheezing or shortness of breath.   aspirin   EC 81 MG EC tablet Take 1 tablet (81 mg total) by mouth daily.   atorvastatin  (LIPITOR) 40 MG tablet Take 1 tablet by mouth daily.   diphenhydrAMINE -APAP, sleep, (TYLENOL  PM EXTRA STRENGTH PO) Take 1 tablet by mouth at bedtime.   feeding supplement (ENSURE ENLIVE / ENSURE PLUS) LIQD Take 237 mLs by mouth 3 (three) times daily between meals.   Fluticasone-Umeclidin-Vilant (TRELEGY ELLIPTA ) 100-62.5-25 MCG/ACT AEPB Inhale 1 puff into the lungs daily in the afternoon.   [START ON 04/17/2024] Fluticasone-Umeclidin-Vilant (TRELEGY ELLIPTA ) 100-62.5-25 MCG/ACT AEPB Inhale 1 puff into the lungs daily.   hydrOXYzine  (ATARAX ) 25 MG tablet Take 1 tablet (25 mg total) by mouth daily.   isosorbide  mononitrate (IMDUR ) 30 MG 24 hr tablet Take 0.5 tablets (15 mg total) by mouth daily.   lipase/protease/amylase (CREON ) 36000 UNITS CPEP capsule TAKE 2 CAPSULES BY MOUTH WITH FIRST BITE OF EACH MEAL AND 1 CAPSULE WITH FIRST BITE OF EACH SNACK   Loratadine  (CLARITIN  PO) Take 1 tablet by mouth daily.   Multiple Vitamin (MULTIVITAMIN WITH MINERALS) TABS tablet Take 1 tablet by mouth daily.   NAPROXEN  PO Take by mouth as needed.   nitroGLYCERIN  (NITROSTAT ) 0.4 MG SL tablet Place 1 tablet (0.4 mg total) under the tongue every 5 (five) minutes as needed for chest pain.    Immunization History  Administered Date(s) Administered   Pneumococcal Polysaccharide-23  10/26/2018   Tdap 02/28/2016        Objective:     BP (!) 126/90 (BP Location: Left Arm, Patient Position: Sitting, Cuff Size: Normal)   Pulse 62   Temp 98.2 F (36.8 C) (Oral)   Ht 5' 10 (1.778 m)   Wt 163 lb (73.9 kg)   SpO2 95%   BMI 23.39 kg/m   SpO2: 95 %  GENERAL: HEAD: Normocephalic, atraumatic.  EYES: Pupils equal, round, reactive to light.  No scleral icterus.  MOUTH:  NECK: Supple. No thyromegaly. Trachea midline. No JVD.  No adenopathy. PULMONARY: Good air entry bilaterally.  No adventitious sounds. CARDIOVASCULAR: S1 and S2.  Regular rate and rhythm.  ABDOMEN: MUSCULOSKELETAL: No joint deformity, no clubbing, no edema.  NEUROLOGIC:  SKIN: Intact,warm,dry. PSYCH:        Assessment & Plan:     ICD-10-CM   1. Stage 2 moderate COPD by GOLD classification (HCC)  J44.9     2. Combined pulmonary fibrosis and emphysema (CPFE) (HCC)  J43.9    J84.10     3. Tobacco dependence due to cigarettes  F17.210       No orders of the defined types were placed in this encounter.   Meds ordered this encounter  Medications   Fluticasone-Umeclidin-Vilant (TRELEGY ELLIPTA ) 100-62.5-25 MCG/ACT AEPB    Sig: Inhale 1 puff into the lungs daily in the afternoon.    Dispense:  2 each    Refill:  0    Lot Number?:   [id number]    Expiration Date?:   05/14/2025    Manufacturer?:   [employer] [[id number]]    NDC:   9826-9112-38 [627739]    Quantity:   2   Fluticasone-Umeclidin-Vilant (TRELEGY ELLIPTA ) 100-62.5-25 MCG/ACT AEPB    Sig: Inhale 1 puff into the lungs daily.    Dispense:  28 each    Refill:  11       Advised if symptoms do not improve or worsen, to please contact office for sooner follow up or seek emergency care.    I spent xxx minutes of dedicated to the care of this patient on the date of this encounter to include pre-visit review of records, face-to-face time with the patient discussing conditions above, post visit ordering of testing, clinical documentation with the electronic health record, making appropriate referrals as documented, and communicating necessary findings to members of the patients care team.     C. Leita Sanders, MD Advanced Bronchoscopy PCCM [facility]    *This note was generated using voice recognition software/Dragon and/or AI transcription program.  Despite best efforts to proofread, errors can occur which can change the meaning. Any transcriptional errors that result from this process are unintentional and may not be fully corrected at the time of dictation.

## 2024-05-04 ENCOUNTER — Other Ambulatory Visit: Payer: Self-pay

## 2024-05-04 MED ORDER — ALBUTEROL SULFATE HFA 108 (90 BASE) MCG/ACT IN AERS: 2.0000 | INHALATION_SPRAY | Freq: Four times a day (QID) | RESPIRATORY_TRACT | 2 refills | Status: AC | PRN

## 2024-07-03 ENCOUNTER — Ambulatory Visit: Admitting: Pulmonary Disease

## 2024-07-03 ENCOUNTER — Encounter: Payer: Self-pay | Admitting: Pulmonary Disease

## 2024-07-03 VITALS — BP 110/72 | HR 63 | Temp 97.7°F | Ht 70.0 in | Wt 164.0 lb

## 2024-07-03 DIAGNOSIS — Z2821 Immunization not carried out because of patient refusal: Secondary | ICD-10-CM

## 2024-07-03 DIAGNOSIS — J449 Chronic obstructive pulmonary disease, unspecified: Secondary | ICD-10-CM

## 2024-07-03 DIAGNOSIS — J439 Emphysema, unspecified: Secondary | ICD-10-CM

## 2024-07-03 DIAGNOSIS — J8410 Pulmonary fibrosis, unspecified: Secondary | ICD-10-CM

## 2024-07-03 DIAGNOSIS — F17210 Nicotine dependence, cigarettes, uncomplicated: Secondary | ICD-10-CM

## 2024-07-03 NOTE — Progress Notes (Unsigned)
 Subjective:    Patient ID: Gabriel Grant, male    DOB: 03-Feb-1961, 63 y.o.   MRN: [id number]  Patient Care Team: Camelia Sherwood BIRCH, FNP as PCP - General (Nurse Practitioner) Darliss Rogue, MD as PCP - Cardiology (Cardiology)  Chief Complaint  Patient presents with   COPD    Occasional cough, shortness of breath and wheezing.     BACKGROUND/INTERVAL:Patient is a 63 year old current smoker who presents for follow-up of dyspnea on exertion.  He was initially seen here on 14 February 2024.   HPI  DATA 02/14/2024 CXR PA and lateral: Increased interstitial markings throughout bilateral lungs which may represent chronic interstitial lung disease. 02/14/2024 ambulatory oximetry: No oxygen desaturations with exercise noted (see visit note). 02/14/2024 alpha-1 antitrypsin: MM phenotype, level 150 mg/dL (normal). 03/13/2024 CT chest high-resolution: Moderate centrilobular and paraseptal emphysema.  Diffuse bilateral bronchial wall thickening consistent with nonspecific infectious or inflammatory bronchitis, groundglass opacities.  Findings suggest combined pulmonary fibrosis with emphysema (CPFE) with with DIP given preponderance of groundglass. 03/27/2024 PFTs: FEV1 2.36 L or 66% predicted, FVC 3.47 L or 73% predicted, FEV1/FVC 68%, no bronchodilator response, lung volumes normal.  Diffusion capacity moderately reduced.   Review of Systems A 10 point review of systems was performed and it is as noted above otherwise negative.   Patient Active Problem List   Diagnosis Date Noted   Malnutrition of moderate degree 01/17/2023   Osteomyelitis (HCC) 01/13/2023   Tobacco use 01/13/2023   Cellulitis of jaw 01/13/2023   History of colonic polyps 11/30/2022   Polyp of sigmoid colon    Abdominal pain, chronic, epigastric    Exocrine pancreatic insufficiency 06/09/2021   Calculus of gallbladder without cholecystitis without obstruction 01/23/2019   CAD (coronary artery disease) 01/20/2017    Essential hypertension 01/20/2017   H/O heart artery stent 01/20/2017   NSTEMI (non-ST elevated myocardial infarction) (HCC) 11/14/2016    Social History   Tobacco Use   Smoking status: Every Day    Current packs/day: 0.25    Average packs/day: 1.7 packs/day for 45.8 years (79.5 ttl pk-yrs)    Types: Cigarettes    Start date: 1980   Smokeless tobacco: Never   Tobacco comments:    Smokes 5-6 cigarettes a day- khj 02/14/2024        Started smoking at 18 years.    Smoked 2 PPD at his heaviest.  Substance Use Topics   Alcohol use: No    Allergies  Allergen Reactions   Codeine Other (See Comments)    Severe headache.   Sulfa Antibiotics Rash    Rash, blisters, photo sensitivity, and some difficulty breathing    Current Meds  Medication Sig   albuterol  (VENTOLIN  HFA) 108 (90 Base) MCG/ACT inhaler Inhale 2 puffs into the lungs every 6 (six) hours as needed for wheezing or shortness of breath.   aspirin  EC 81 MG EC tablet Take 1 tablet (81 mg total) by mouth daily.   atorvastatin  (LIPITOR) 40 MG tablet Take 1 tablet by mouth daily.   diphenhydrAMINE -APAP, sleep, (TYLENOL  PM EXTRA STRENGTH PO) Take 1 tablet by mouth at bedtime.   feeding supplement (ENSURE ENLIVE / ENSURE PLUS) LIQD Take 237 mLs by mouth 3 (three) times daily between meals.   Fluticasone-Umeclidin-Vilant (TRELEGY ELLIPTA ) 100-62.5-25 MCG/ACT AEPB Inhale 1 puff into the lungs daily.   hydrOXYzine  (ATARAX ) 25 MG tablet Take 1 tablet (25 mg total) by mouth daily.   isosorbide  mononitrate (IMDUR ) 30 MG 24 hr tablet Take 0.5 tablets (15  mg total) by mouth daily.   lipase/protease/amylase (CREON ) 36000 UNITS CPEP capsule TAKE 2 CAPSULES BY MOUTH WITH FIRST BITE OF EACH MEAL AND 1 CAPSULE WITH FIRST BITE OF EACH SNACK   Loratadine  (CLARITIN  PO) Take 1 tablet by mouth daily.   Multiple Vitamin (MULTIVITAMIN WITH MINERALS) TABS tablet Take 1 tablet by mouth daily.   NAPROXEN  PO Take by mouth as needed.   nitroGLYCERIN   (NITROSTAT ) 0.4 MG SL tablet Place 1 tablet (0.4 mg total) under the tongue every 5 (five) minutes as needed for chest pain.    Immunization History  Administered Date(s) Administered   Pneumococcal Polysaccharide-23 10/26/2018   Tdap 02/28/2016        Objective:     BP 110/72   Pulse 63   Temp 97.7 F (36.5 C) (Temporal)   Ht 5' 10 (1.778 m)   Wt 164 lb (74.4 kg)   SpO2 98%   BMI 23.53 kg/m   SpO2: 98 %  GENERAL: Well-developed, well-nourished gentleman, no acute distress.  Fully ambulatory.  No conversational dyspnea. HEAD: Normocephalic, atraumatic.  EYES: Pupils equal, round, reactive to light.  No scleral icterus.  MOUTH: There is poor dentition with some missing teeth.  No thrush.  Oral mucosa moist. NECK: Supple. No thyromegaly. Trachea midline. No JVD.  No adenopathy. PULMONARY: Good air entry bilaterally.  Coarse with no other adventitious sounds. CARDIOVASCULAR: S1 and S2. Regular rate and rhythm.  No rubs, murmurs or gallops heard. ABDOMEN: Benign. MUSCULOSKELETAL: No joint deformity, no clubbing, no edema.  NEUROLOGIC: No overt focal deficit, no gait disturbance, speech is fluent. SKIN: Intact,warm,dry. PSYCH: Mood and behavior normal.        Assessment & Plan:   No diagnosis found.  No orders of the defined types were placed in this encounter.   No orders of the defined types were placed in this encounter.     Advised if symptoms do not improve or worsen, to please contact office for sooner follow up or seek emergency care.    I spent xxx minutes of dedicated to the care of this patient on the date of this encounter to include pre-visit review of records, face-to-face time with the patient discussing conditions above, post visit ordering of testing, clinical documentation with the electronic health record, making appropriate referrals as documented, and communicating necessary findings to members of the patients care team.     C. Leita Sanders, MD Advanced Bronchoscopy PCCM [facility]    *This note was generated using voice recognition software/Dragon and/or AI transcription program.  Despite best efforts to proofread, errors can occur which can change the meaning. Any transcriptional errors that result from this process are unintentional and may not be fully corrected at the time of dictation.

## 2024-07-03 NOTE — Patient Instructions (Signed)
 VISIT SUMMARY:  You had a follow-up appointment today to check on your COPD and nicotine  dependence. You mentioned that you are feeling much better and that the inhaler has been helpful in managing your symptoms. You are also making significant progress in reducing your cigarette consumption.  YOUR PLAN:  -CHRONIC OBSTRUCTIVE PULMONARY DISEASE (COPD): COPD is a chronic lung condition that makes it hard to breathe. Your COPD is well-managed with your current inhaler therapy, which you should continue using as it is effective and affordable with your insurance coverage.  -NICOTINE  DEPENDENCE, CIGARETTES: Nicotine  dependence is an addiction to tobacco products. You have reduced your cigarette consumption to four cigarettes a day, which is a significant improvement. Continue to work on reducing your cigarette consumption further.  INSTRUCTIONS:  Please continue using your inhaler as prescribed and keep working on reducing your cigarette consumption. If you have any concerns or if your symptoms worsen, schedule a follow-up appointment.

## 2024-11-06 ENCOUNTER — Ambulatory Visit: Admitting: Pulmonary Disease
